# Patient Record
Sex: Male | Born: 1985 | Race: Black or African American | Hispanic: No | Marital: Married | Smoking: Current some day smoker
Health system: Southern US, Community
[De-identification: ages and names within clinical notes are randomized; demographics above are authoritative.]

## PROBLEM LIST (undated history)

## (undated) HISTORY — PX: HERNIA REPAIR: SHX51

---

## 2014-02-20 ENCOUNTER — Emergency Department (HOSPITAL_COMMUNITY)
Admission: EM | Admit: 2014-02-20 | Discharge: 2014-02-20 | Disposition: A | Discharge status: Home / Self Care | Payer: BC Managed Care – PPO | Attending: Emergency Medicine | Admitting: Emergency Medicine

## 2014-02-20 ENCOUNTER — Encounter (HOSPITAL_COMMUNITY): Payer: Self-pay | Admitting: Emergency Medicine

## 2014-02-20 DIAGNOSIS — L02219 Cutaneous abscess of trunk, unspecified: Secondary | ICD-10-CM | POA: Insufficient documentation

## 2014-02-20 DIAGNOSIS — L03319 Cellulitis of trunk, unspecified: Principal | ICD-10-CM

## 2014-02-20 DIAGNOSIS — L02214 Cutaneous abscess of groin: Secondary | ICD-10-CM

## 2014-02-20 DIAGNOSIS — F17200 Nicotine dependence, unspecified, uncomplicated: Secondary | ICD-10-CM | POA: Insufficient documentation

## 2014-02-20 MED ORDER — HYDROCODONE-ACETAMINOPHEN 5-325 MG PO TABS: 1.0000 | ORAL_TABLET | Freq: Four times a day (QID) | ORAL | Status: DC | PRN

## 2014-02-20 NOTE — Discharge Instructions (Signed)
1. Medications: vicodin for pain, usual home medications 2. Treatment: rest, drink plenty of fluids, warm compresses 3. Follow Up: Please followup with the emergency department if symptoms worsen, the urgent care Center in 48 hours for weight check and packing removal.  Followup with your doctor or an urgent care in order to remove your packing in 48-72 hours. You may return to the emergency department if you have  a fever that persists greater than 101 or your abscess appears to become infected (growing surrounding redness and warmth). Do not operate any heavy machinery while on pain medications. Do not consume alcohol on these medications either.  Abscess An abscess (boil or furuncle) is an infected area that contains a collection of pus.  SYMPTOMS Signs and symptoms of an abscess include pain, tenderness, redness, or hardness. You may feel a moveable soft area under your skin. An abscess can occur anywhere in the body.  TREATMENT  A surgical cut (incision) may be made over your abscess to drain the pus. Gauze may be packed into the space or a drain may be looped through the abscess cavity (pocket). This provides a drain that will allow the cavity to heal from the inside outwards. The abscess may be painful for a few days, but should feel much better if it was drained.  Your abscess, if seen early, may not have localized and may not have been drained. If not, another appointment may be required if it does not get better on its own or with medications. HOME CARE INSTRUCTIONS   Only take over-the-counter or prescription medicines for pain, discomfort, or fever as directed by your caregiver.   Take your antibiotics as directed if they were prescribed. Finish them even if you start to feel better.   Keep the skin and clothes clean around your abscess.   If the abscess was drained, you will need to use gauze dressing to collect any draining pus. Dressings will typically need to be changed 3 or more  times a day.   The infection may spread by skin contact with others. Avoid skin contact as much as possible.   Practice good hygiene. This includes regular hand washing, cover any draining skin lesions, and do not share personal care items.   If you participate in sports, do not share athletic equipment, towels, whirlpools, or personal care items. Shower after every practice or tournament.   If a draining area cannot be adequately covered:   Do not participate in sports.   Children should not participate in day care until the wound has healed or drainage stops.   If your caregiver has given you a follow-up appointment, it is very important to keep that appointment. Not keeping the appointment could result in a much worse infection, chronic or permanent injury, pain, and disability. If there is any problem keeping the appointment, you must call back to this facility for assistance.  SEEK MEDICAL CARE IF:   You develop increased pain, swelling, redness, drainage, or bleeding in the wound site.   You develop signs of generalized infection including muscle aches, chills, fever, or a general ill feeling.   You have an oral temperature above 102 F (38.9 C).  MAKE SURE YOU:   Understand these instructions.   Will watch your condition.   Will get help right away if you are not doing well or get worse.  Document Released: 08/31/2005 Document Revised: 08/03/2011 Document Reviewed: 06/24/2008 [facility] Patient Information 2012 [place], [place].

## 2014-02-20 NOTE — ED Provider Notes (Signed)
CSN: [id number]     Arrival date & time 02/20/14  1721 History   This chart was scribed for non-physician practitioner Cody ForthHannah Kashmir Leedy, PA-C working with Cody PoundMichael Y. Oletta LamasGhim, MD by Cody Owens, ED Scribe. This patient was seen in room TR10C/TR10C and the patient's care was started at 1813.   First MD Initiated Contact with Patient 02/20/14 1813     Chief Complaint  Patient presents with  . Leg Pain    The history is provided by the patient and medical records. No language interpreter was used.   HPI Comments: Cody SledgeMichael Eisen is a [age] y.o. male who presents to the Emergency Department complaining of gradual onset, gradually worsening right upper thigh pain and swelling that began last night. He has not tried any treatments. Nothing makes it better and walking makes it worse. He reports it has slowly worsened throughout the day. He denies a hx of abscesses. He denies rectal pain, testicular pain, scrotum pain or any other symptoms. He also denies urinary hesitancy or pain with defecation.  He currently smokes .5 pack of cigarettes/day.    History reviewed. No pertinent past medical history. Past Surgical History  Procedure Laterality Date  . Hernia repair      L inguinal   No family history on file. History  Substance Use Topics  . Smoking status: Current Every Day Smoker -- 0.50 packs/day    Types: Cigarettes  . Smokeless tobacco: Not on file  . Alcohol Use: 4.2 oz/week    7 Cans of beer per week    Review of Systems  Constitutional: Negative for fever and chills.  HENT: Negative for congestion and sore throat.   Gastrointestinal: Negative for nausea and vomiting.  Genitourinary: Negative for scrotal swelling, penile pain and testicular pain.  Skin: Positive for color change.      Allergies  Review of patient's allergies indicates no known allergies.  Home Medications   Current Outpatient Rx  Name  Route  Sig  Dispense  Refill  . HYDROcodone-acetaminophen  (NORCO/VICODIN) 5-325 MG per tablet   Oral   Take 1-2 tablets by mouth every 6 (six) hours as needed.   6 tablet   0     BP 132/73  Pulse 97  Temp(Src) 98.5 F (36.9 C) (Oral)  Resp 18  Ht 5\' 6"  (1.676 m)  Wt 232 lb (105.235 kg)  BMI 37.46 kg/m2  SpO2 98%  Physical Exam  Nursing note and vitals reviewed. Constitutional: He is oriented to person, place, and time. He appears well-developed and well-nourished. No distress.  HENT:  Head: Normocephalic and atraumatic.  Eyes: Conjunctivae are normal. No scleral icterus.  Neck: Normal range of motion.  Cardiovascular: Normal rate, regular rhythm, normal heart sounds and intact distal pulses.   No murmur heard. Pulmonary/Chest: Effort normal and breath sounds normal. No respiratory distress. He has no wheezes.  Abdominal: Soft. He exhibits no distension. There is no tenderness. Hernia confirmed negative in the right inguinal area and confirmed negative in the left inguinal area.  Genitourinary: Testes normal and penis normal. Rectal exam shows no external hemorrhoid, no fissure, no mass and no tenderness.    Right testis shows no mass, no swelling and no tenderness. Right testis is descended. Left testis shows no mass, no swelling and no tenderness. Left testis is descended. No phimosis, paraphimosis, hypospadias, penile erythema or penile tenderness. No discharge found.  3 cm x 2 cm area of erythema and induration at the top of the right thigh where  it joins the groin with mild pain to palpation of the right groin and mild lymphadenopathy. No swelling, erythema, induration or tenderness of the scrotum, testicles, perineum or perirectal region.   Lymphadenopathy:    He has no cervical adenopathy.       Right: Inguinal (mild) adenopathy present.       Left: No inguinal adenopathy present.  Neurological: He is alert and oriented to person, place, and time.  Skin: Skin is warm and dry. He is not diaphoretic. There is erythema.   Psychiatric: He has a normal mood and affect.    ED Course  Procedures (including critical care time) DIAGNOSTIC STUDIES: Oxygen Saturation is 98% on RA, normal by my interpretation.    COORDINATION OF CARE: 6:22 PM Discussed treatment plan which includes incision and drainage with pt at bedside and pt agreed to plan.   INCISION AND DRAINAGE PROCEDURE NOTE: Patient identification was confirmed and verbal consent was obtained. This procedure was performed by Cody Forth, PA-C at 6:35 PM. Site: top of right thigh Sterile procedures observed Needle size: 25 gauge Anesthetic used (type and amt): 5 cc Blade size: 11 lenght Drainage: copious  Complexity: Complex Packing used Site anesthetized, incision made over site, wound drained and explored loculations, rinsed with copious amounts of normal saline, wound packed with sterile gauze, covered with dry, sterile dressing.  Pt tolerated procedure well without complications.  Instructions for care discussed verbally and pt provided with additional written instructions for homecare and f/u.   Labs Review Labs Reviewed - No data to display Imaging Review No results found.   EKG Interpretation None      MDM   Final diagnoses:  Abscess of right groin   Cody Owens presents with history and physical consistent with abscess to the right upper thigh where it joins the groin. Thorough exam without evidence of Fournier's gangrene, perirectal abscess or scrotal abscess.  Patient is afebrile and not tachycardic.  He has no history of diabetes or other immunosuppression to decrease healing of the wound or increase infection.  Patient with skin abscess amenable to incision and drainage.  Abscess large enough to warrant packing, wound recheck in 2 days. Encouraged home warm compress.  Mild signs of cellulitis is surrounding skin.  Will d/c to home.  No antibiotic therapy is indicated.  Patient given strict followup instructions  for packing removal and wound check within 48 hours. Patient also warned about symptoms of Fournier's gangrene including fevers, pain with defecation, swelling or pain of the scrotum or rectum. He's been instructed to return immediately to the emergency department the symptoms develop.  It has been determined that no acute conditions requiring further emergency intervention are present at this time. The patient/guardian have been advised of the diagnosis and plan. We have discussed signs and symptoms that warrant return to the ED, such as changes or worsening in symptoms.   Vital signs are stable at discharge.   BP 132/73  Pulse 97  Temp(Src) 98.5 F (36.9 C) (Oral)  Resp 18  Ht 5\' 6"  (1.676 m)  Wt 232 lb (105.235 kg)  BMI 37.46 kg/m2  SpO2 98%  Patient/guardian has voiced understanding and agreed to follow-up with the PCP or specialist.    I personally performed the services described in this documentation, which was scribed in my presence. The recorded information has been reviewed and is accurate.    Cody Forth, PA-C 02/20/14 1902

## 2014-02-20 NOTE — ED Notes (Signed)
Pt c/o pain to inner thigh since yesterday.  Swelling and pain on palpation to inner thigh.

## 2014-02-22 ENCOUNTER — Emergency Department (HOSPITAL_COMMUNITY)
Admission: EM | Admit: 2014-02-22 | Discharge: 2014-02-22 | Disposition: A | Discharge status: Home / Self Care | Payer: BC Managed Care – PPO | Source: Home / Self Care | Attending: Family Medicine | Admitting: Family Medicine

## 2014-02-23 ENCOUNTER — Emergency Department (HOSPITAL_COMMUNITY)
Admission: EM | Admit: 2014-02-23 | Discharge: 2014-02-23 | Disposition: A | Discharge status: Home / Self Care | Payer: BC Managed Care – PPO | Attending: Emergency Medicine | Admitting: Emergency Medicine

## 2014-02-23 ENCOUNTER — Encounter (HOSPITAL_COMMUNITY): Payer: Self-pay | Admitting: Emergency Medicine

## 2014-02-23 DIAGNOSIS — L03319 Cellulitis of trunk, unspecified: Principal | ICD-10-CM

## 2014-02-23 DIAGNOSIS — L02219 Cutaneous abscess of trunk, unspecified: Secondary | ICD-10-CM | POA: Insufficient documentation

## 2014-02-23 DIAGNOSIS — F17200 Nicotine dependence, unspecified, uncomplicated: Secondary | ICD-10-CM | POA: Insufficient documentation

## 2014-02-23 DIAGNOSIS — L02214 Cutaneous abscess of groin: Secondary | ICD-10-CM

## 2014-02-23 DIAGNOSIS — Z5189 Encounter for other specified aftercare: Secondary | ICD-10-CM

## 2014-02-23 NOTE — ED Provider Notes (Signed)
CSN: [id number]     Arrival date & time 02/23/14  [id number] History  This chart was scribed for non-physician practitioner Jaynie Crumbleatyana Roxana Lai working with Hurman HornJohn M Bednar, MD by Carl Bestelina Holson, ED Scribe. This patient was seen in room TR09C/TR09C and the patient's care was started at 9:10 AM.    No chief complaint on file.   The history is provided by the patient. No language interpreter was used.   HPI Comments: Cody Owens is a 28 y.o. male who presents to the Emergency Department for packing removal from an incised and drained inguinal abscess.  He states that the procedure was done 3 days ago and is not as painful but is still draining.  The patient states that he has been changing the dressing regularly.  He denies fever and chills as associated symptoms.  He states that he was not discharged with antibiotics.  He denies having a history of abscesses.    No past medical history on file. Past Surgical History  Procedure Laterality Date  . Hernia repair      L inguinal   No family history on file. History  Substance Use Topics  . Smoking status: Current Every Day Smoker -- 0.50 packs/day    Types: Cigarettes  . Smokeless tobacco: Not on file  . Alcohol Use: 4.2 oz/week    7 Cans of beer per week    Review of Systems  Skin: Positive for wound (inguinal I&D site).  All other systems reviewed and are negative.      Allergies  Review of patient's allergies indicates no known allergies.  Home Medications   Current Outpatient Rx  Name  Route  Sig  Dispense  Refill  . HYDROcodone-acetaminophen (NORCO/VICODIN) 5-325 MG per tablet   Oral   Take 1-2 tablets by mouth every 6 (six) hours as needed.   6 tablet   0    BP 142/71  Pulse 88  Temp(Src) 98.8 F (37.1 C) (Oral)  Resp 20  Ht 5\' 6"  (1.676 m)  Wt 232 lb (105.235 kg)  BMI 37.46 kg/m2  SpO2 100%  Physical Exam  Nursing note and vitals reviewed. Constitutional: He is oriented to person, place, and time. He appears  well-developed and well-nourished.  HENT:  Head: Normocephalic and atraumatic.  Eyes: EOM are normal.  Neck: Normal range of motion.  Cardiovascular: Normal rate.   Pulmonary/Chest: Effort normal.  Musculoskeletal: Normal range of motion.  Neurological: He is alert and oriented to person, place, and time.  Skin: Skin is warm and dry.  2 cm abscess with packing to the right groin.  No surrounding erythema or swelling.  Minimal bloody drainage.   Psychiatric: He has a normal mood and affect. His behavior is normal.    ED Course  Procedures (including critical care time)  DIAGNOSTIC STUDIES: Oxygen Saturation is 100% on room air, normal by my interpretation.    COORDINATION OF CARE: 9:13 AM- Removed the packing from the patient's I&D site.  Advised the patient to take a warm bath several times to a day and monitor the area.  The patient agreed to the treatment plan.      Labs Review Labs Reviewed - No data to display Imaging Review No results found.   EKG Interpretation None      MDM   Final diagnoses:  Abscess of right groin  Encounter for wound re-check    Pt here for abscess recheck. Had an abscess of the right groin incised and drained 3 days  ago. States was doing well. He's not currently taking any medications, he was not placed on antibiotics. He denies any fever or chills. Abscess appears to be healing well, no surrounding swelling, erythema, induration. Minimal bloody drainage. Packing was removed. Sterile dressing applied. Discussed careful wound care at home including washing the wound with soap and water. Patient is to followup with his Dr. as needed.  Filed Vitals:   02/23/14 0901  BP: 142/71  Pulse: 88  Temp: 98.8 F (37.1 C)  TempSrc: Oral  Resp: 20  Height: 5\' 6"  (1.676 m)  Weight: 232 lb (105.235 kg)  SpO2: 100%    I personally performed the services described in this documentation, which was scribed in my presence. The recorded information has  been reviewed and is accurate.    Lottie Mussel, PA-C 02/23/14 1119

## 2014-02-23 NOTE — Discharge Instructions (Signed)
Continue to keep an eye on the wound. Warm soapy baths few times a day. Keep wound clean. Wash with soap and water. Follow up as needed. Return if increased swelling, drainage, redness, fever.    Abscess An abscess is an infected area that contains a collection of pus and debris.It can occur in almost any part of the body. An abscess is also known as a furuncle or boil. CAUSES  An abscess occurs when tissue gets infected. This can occur from blockage of oil or sweat glands, infection of hair follicles, or a minor injury to the skin. As the body tries to fight the infection, pus collects in the area and creates pressure under the skin. This pressure causes pain. People with weakened immune systems have difficulty fighting infections and get certain abscesses more often.  SYMPTOMS Usually an abscess develops on the skin and becomes a painful mass that is red, warm, and tender. If the abscess forms under the skin, you may feel a moveable soft area under the skin. Some abscesses break open (rupture) on their own, but most will continue to get worse without care. The infection can spread deeper into the body and eventually into the bloodstream, causing you to feel ill.  DIAGNOSIS  Your caregiver will take your medical history and perform a physical exam. A sample of fluid may also be taken from the abscess to determine what is causing your infection. TREATMENT  Your caregiver may prescribe antibiotic medicines to fight the infection. However, taking antibiotics alone usually does not cure an abscess. Your caregiver may need to make a small cut (incision) in the abscess to drain the pus. In some cases, gauze is packed into the abscess to reduce pain and to continue draining the area. HOME CARE INSTRUCTIONS   Only take over-the-counter or prescription medicines for pain, discomfort, or fever as directed by your caregiver.  If you were prescribed antibiotics, take them as directed. Finish them even if you  start to feel better.  If gauze is used, follow your caregiver's directions for changing the gauze.  To avoid spreading the infection:  Keep your draining abscess covered with a bandage.  Wash your hands well.  Do not share personal care items, towels, or whirlpools with others.  Avoid skin contact with others.  Keep your skin and clothes clean around the abscess.  Keep all follow-up appointments as directed by your caregiver. SEEK MEDICAL CARE IF:   You have increased pain, swelling, redness, fluid drainage, or bleeding.  You have muscle aches, chills, or a general ill feeling.  You have a fever. MAKE SURE YOU:   Understand these instructions.  Will watch your condition.  Will get help right away if you are not doing well or get worse. Document Released: 08/31/2005 Document Revised: 05/22/2012 Document Reviewed: 02/03/2012 [facility] Patient Information 2014 [place] NorthExit[place], [place].

## 2014-02-23 NOTE — ED Notes (Signed)
Pt states here for packing removal to right groin area.

## 2014-02-24 NOTE — ED Provider Notes (Signed)
Medical screening examination/treatment/procedure(s) were performed by non-physician practitioner and as supervising physician I was immediately available for consultation/collaboration.  [name], MD 02/24/14 [phone]

## 2014-02-25 NOTE — ED Provider Notes (Signed)
Medical screening examination/treatment/procedure(s) were performed by non-physician practitioner and as supervising physician I was immediately available for consultation/collaboration.     Hurman HornJohn M Kinshasa Throckmorton, MD 02/25/14 [phone]

## 2014-04-02 ENCOUNTER — Encounter (HOSPITAL_COMMUNITY): Payer: Self-pay | Admitting: Emergency Medicine

## 2014-04-02 ENCOUNTER — Emergency Department (HOSPITAL_COMMUNITY)
Admission: EM | Admit: 2014-04-02 | Discharge: 2014-04-02 | Disposition: A | Discharge status: Home / Self Care | Payer: BC Managed Care – PPO | Attending: Emergency Medicine | Admitting: Emergency Medicine

## 2014-04-02 DIAGNOSIS — F17200 Nicotine dependence, unspecified, uncomplicated: Secondary | ICD-10-CM | POA: Insufficient documentation

## 2014-04-02 DIAGNOSIS — Z23 Encounter for immunization: Secondary | ICD-10-CM | POA: Insufficient documentation

## 2014-04-02 DIAGNOSIS — Z8719 Personal history of other diseases of the digestive system: Secondary | ICD-10-CM | POA: Insufficient documentation

## 2014-04-02 DIAGNOSIS — Y929 Unspecified place or not applicable: Secondary | ICD-10-CM | POA: Insufficient documentation

## 2014-04-02 DIAGNOSIS — IMO0002 Reserved for concepts with insufficient information to code with codable children: Secondary | ICD-10-CM | POA: Insufficient documentation

## 2014-04-02 DIAGNOSIS — S0531XA Ocular laceration without prolapse or loss of intraocular tissue, right eye, initial encounter: Secondary | ICD-10-CM

## 2014-04-02 DIAGNOSIS — H11429 Conjunctival edema, unspecified eye: Secondary | ICD-10-CM | POA: Insufficient documentation

## 2014-04-02 DIAGNOSIS — S0530XA Ocular laceration without prolapse or loss of intraocular tissue, unspecified eye, initial encounter: Secondary | ICD-10-CM | POA: Insufficient documentation

## 2014-04-02 DIAGNOSIS — Y9389 Activity, other specified: Secondary | ICD-10-CM | POA: Insufficient documentation

## 2014-04-02 DIAGNOSIS — S058X9A Other injuries of unspecified eye and orbit, initial encounter: Secondary | ICD-10-CM | POA: Insufficient documentation

## 2014-04-02 MED ORDER — MOXIFLOXACIN HCL 0.5 % OP SOLN: 1.0000 [drp] | Freq: Four times a day (QID) | OPHTHALMIC | Status: DC

## 2014-04-02 MED ORDER — TETANUS-DIPHTH-ACELL PERTUSSIS 5-2.5-18.5 LF-MCG/0.5 IM SUSP
0.5000 mL | Freq: Once | INTRAMUSCULAR | Status: AC
  Administered 2014-04-02: 0.5 mL via INTRAMUSCULAR
  Filled 2014-04-02: qty 0.5

## 2014-04-02 MED ORDER — ATROPINE SULFATE 1 % OP OINT: 1.0000 "application " | TOPICAL_OINTMENT | Freq: Every day | OPHTHALMIC | Status: DC

## 2014-04-02 MED ORDER — FLUORESCEIN SODIUM 1 MG OP STRP
1.0000 | ORAL_STRIP | Freq: Once | OPHTHALMIC | Status: DC
  Filled 2014-04-02: qty 1

## 2014-04-02 MED ORDER — TETRACAINE HCL 0.5 % OP SOLN
2.0000 [drp] | Freq: Once | OPHTHALMIC | Status: DC
  Filled 2014-04-02: qty 2

## 2014-04-02 NOTE — ED Provider Notes (Signed)
Medical screening examination/treatment/procedure(s) were performed by non-physician practitioner and as supervising physician I was immediately available for consultation/collaboration.   EKG Interpretation None       Thelbert Gartin K Linker, MD 04/02/14 1844 

## 2014-04-02 NOTE — ED Provider Notes (Signed)
CSN: [id number]     Arrival date & time 04/02/14  1530 History  This chart was scribed for non-physician practitioner [name], PA-C working with Ethelda ChickMartha K Linker, MD by Dorothey Basemania Sutton, ED Scribe. This patient was seen in room TR04C/TR04C and the patient's care was started at 4:21 PM.    Chief Complaint  Patient presents with  . Eye Injury   The history is provided by the patient and medical records. No language interpreter was used.   HPI Comments: Cody Owens is a 28 y.o. male who presents to the Emergency Department complaining of an injury to the right eye that he sustained last night, around 16 hours ago, by accidentally hitting the area with an electrical plug. Patient is complaining of a constant, sudden onset pain to the area with associated eye redness, drainage, and blurred vision. Patient does not remember when his last tetanus vaccination was. He denies any allergies to medications. Patient has no other pertinent medical history.   History reviewed. No pertinent past medical history. Past Surgical History  Procedure Laterality Date  . Hernia repair      L inguinal   History reviewed. No pertinent family history. History  Substance Use Topics  . Smoking status: Current Every Day Smoker -- 0.50 packs/day    Types: Cigarettes  . Smokeless tobacco: Not on file  . Alcohol Use: 4.2 oz/week    7 Cans of beer per week    Review of Systems  Eyes: Positive for pain, discharge, redness and visual disturbance.  All other systems reviewed and are negative.  Allergies  Review of patient's allergies indicates no known allergies.  Home Medications   Prior to Admission medications   Not on File   Triage Vitals: BP 147/68  Pulse 78  Temp(Src) 99.1 F (37.3 C) (Oral)  Resp 20  SpO2 98%  Physical Exam  Nursing note and vitals reviewed. Constitutional: He appears well-developed and well-nourished. No distress.  Awake, alert, nontoxic appearance  HENT:  Head:  Normocephalic and atraumatic.  Mouth/Throat: Oropharynx is clear and moist. No oropharyngeal exudate.  Eyes: EOM are normal. Pupils are equal, round, and reactive to light. Lids are everted and swept, no foreign bodies found. Right eye exhibits chemosis (mild) and discharge (moderate, purulent). Right eye exhibits no hordeolum. No foreign body present in the right eye. Left eye exhibits no chemosis, no discharge, no exudate and no hordeolum. No foreign body present in the left eye. Right conjunctiva is injected. Right conjunctiva has no hemorrhage. Left conjunctiva is not injected. Left conjunctiva has no hemorrhage. No scleral icterus.  Fundoscopic exam:      The right eye shows no arteriolar narrowing, no AV nicking, no exudate, no hemorrhage and no papilledema.       The left eye shows no arteriolar narrowing, no AV nicking, no exudate, no hemorrhage and no papilledema.  Slit lamp exam:      The right eye shows corneal abrasion and fluorescein uptake. The right eye shows no corneal flare, no corneal ulcer, no foreign body, no hyphema and no anterior chamber bulge.       The left eye shows no hyphema and no anterior chamber bulge.    Visible, purulent drainage from the right eye. Injected cornea on the anterior side. Full EOMs without pain or diplopia. Superficial 0.5cm linear laceration to the cornea at the 12:00 position above the iris with associated abrasion at the most superior portion of the laceration. Negative Seidel's sign.  Visual Acuity -  Bilateral Distance: 20/20 ;  R Distance: 20/30 ;  L Distance: 20/20  Neck: Normal range of motion. Neck supple.  Cardiovascular: Normal rate, regular rhythm, normal heart sounds and intact distal pulses.   No murmur heard. Pulmonary/Chest: Effort normal and breath sounds normal. No respiratory distress. He has no wheezes.  Musculoskeletal: Normal range of motion. He exhibits no edema.  Neurological: He is alert.  Speech is clear and goal  oriented Moves extremities without ataxia  Skin: Skin is warm and dry. He is not diaphoretic.  Psychiatric: He has a normal mood and affect.    ED Course  Procedures (including critical care time)  DIAGNOSTIC STUDIES: Oxygen Saturation is 98% on room air, normal by my interpretation.    COORDINATION OF CARE: 4:29 PM- Discussed that symptoms are due to an abrasion in the area. Will discharge patient with antibiotics and pain medication to manage symptoms. Advised patient to follow up with the referred ophthalmologist tomorrow. Discussed treatment plan with patient at bedside and patient verbalized agreement.     Labs Review Labs Reviewed - No data to display  Imaging Review No results found.   EKG Interpretation None      MDM   Final diagnoses:  Corneal laceration of right eye   Cody SledgeMichael Brasel presents with eye injury.  Exam consistent with corneal laceration; no Seidel's sign; doubt globe rupture.  Pt with decreased vision in the right eye and purulent drainage.  Concern for possible secondary infection. Will update tetanus and consult with optho.   6:18 PM  Discussed with Dr. Allena KatzPatel who recommends Vigamox QID and Atropine QD.  She will see the pt in her office tomorrow.  All of this has been discussed with the patient and he agrees to see Dr. Allena KatzPatel tomorrow.  He may return here if symptoms worsen, but ultimately he will need to see opthalmology.    It has been determined that no acute conditions requiring further emergency intervention are present at this time. The patient/guardian have been advised of the diagnosis and plan. We have discussed signs and symptoms that warrant return to the ED, such as changes or worsening in symptoms.   Vital signs are stable at discharge.   BP 147/68  Pulse 78  Temp(Src) 99.1 F (37.3 C) (Oral)  Resp 20  SpO2 98%  Patient/guardian has voiced understanding and agreed to follow-up with the PCP or specialist.   I personally performed  the services described in this documentation, which was scribed in my presence. The recorded information has been reviewed and is accurate.    [name] Tony, PA-C 04/02/14 1826

## 2014-04-02 NOTE — Discharge Instructions (Signed)
1. Medications: vigamox, atropine, usual home medications 2. Treatment: rest, drink plenty of fluids, take medications as prescribed 3. Follow Up: PLEASE CALL DR PATEL'S OFFICE IN THE MORNING TO SCHEDULE A FOLLOW-UP APPOINTMENT FOR TOMORROW

## 2014-04-02 NOTE — ED Notes (Signed)
Per pt sts he hit right eye pulling a cord out of a plug last night and since eye has been painful, cloudy and red. sts he can see out of eye just blurry.

## 2015-08-27 ENCOUNTER — Emergency Department (HOSPITAL_COMMUNITY)
Admission: EM | Admit: 2015-08-27 | Discharge: 2015-08-28 | Disposition: A | Discharge status: Home / Self Care | Payer: BLUE CROSS/BLUE SHIELD | Attending: Emergency Medicine | Admitting: Emergency Medicine

## 2015-08-27 ENCOUNTER — Encounter (HOSPITAL_COMMUNITY): Payer: Self-pay | Admitting: *Deleted

## 2015-08-27 DIAGNOSIS — S61511A Laceration without foreign body of right wrist, initial encounter: Secondary | ICD-10-CM | POA: Diagnosis present

## 2015-08-27 DIAGNOSIS — Y998 Other external cause status: Secondary | ICD-10-CM | POA: Diagnosis not present

## 2015-08-27 DIAGNOSIS — Z720 Tobacco use: Secondary | ICD-10-CM | POA: Diagnosis not present

## 2015-08-27 DIAGNOSIS — Z23 Encounter for immunization: Secondary | ICD-10-CM | POA: Insufficient documentation

## 2015-08-27 DIAGNOSIS — Y9389 Activity, other specified: Secondary | ICD-10-CM | POA: Insufficient documentation

## 2015-08-27 DIAGNOSIS — S0081XA Abrasion of other part of head, initial encounter: Secondary | ICD-10-CM | POA: Insufficient documentation

## 2015-08-27 DIAGNOSIS — W25XXXA Contact with sharp glass, initial encounter: Secondary | ICD-10-CM | POA: Insufficient documentation

## 2015-08-27 DIAGNOSIS — F10120 Alcohol abuse with intoxication, uncomplicated: Secondary | ICD-10-CM | POA: Diagnosis not present

## 2015-08-27 DIAGNOSIS — IMO0002 Reserved for concepts with insufficient information to code with codable children: Secondary | ICD-10-CM

## 2015-08-27 DIAGNOSIS — Z79899 Other long term (current) drug therapy: Secondary | ICD-10-CM | POA: Insufficient documentation

## 2015-08-27 DIAGNOSIS — Y9289 Other specified places as the place of occurrence of the external cause: Secondary | ICD-10-CM | POA: Diagnosis not present

## 2015-08-27 DIAGNOSIS — Z792 Long term (current) use of antibiotics: Secondary | ICD-10-CM | POA: Diagnosis not present

## 2015-08-27 DIAGNOSIS — F10920 Alcohol use, unspecified with intoxication, uncomplicated: Secondary | ICD-10-CM

## 2015-08-27 MED ORDER — LIDOCAINE HCL (PF) 1 % IJ SOLN
5.0000 mL | Freq: Once | INTRAMUSCULAR | Status: AC
  Administered 2015-08-28: 5 mL via INTRADERMAL
  Filled 2015-08-27: qty 5

## 2015-08-27 MED ORDER — TETANUS-DIPHTH-ACELL PERTUSSIS 5-2.5-18.5 LF-MCG/0.5 IM SUSP
0.5000 mL | Freq: Once | INTRAMUSCULAR | Status: AC
  Administered 2015-08-28: 0.5 mL via INTRAMUSCULAR
  Filled 2015-08-27 (×2): qty 0.5

## 2015-08-27 NOTE — ED Provider Notes (Signed)
CSN: [id number]     Arrival date & time 08/27/15  2335 History  This chart was scribed for non-physician practitioner Catha Gosselin, PA-C working with Dione Booze, MD by Lyndel Safe, ED Scribe. This patient was seen in room TR07C/TR07C and the patient's care was started at 11:48 PM.   Chief Complaint  Patient presents with  . Extremity Laceration   The history is provided by the patient. No language interpreter was used.   HPI Comments: Cody Owens is a 29 y.o. male brought in by ambulance, who presents to the Emergency Department complaining of a laceration to right wrist and a small abrasion to right eyebrow that occurred PTA s/p fall into a glass coffee table. Per EMS the patient is intoxicated. His girlfriend reports that she heard a loud noise while sleeping and went to check on him. She states that she found him laying on the floor. She also reports that he drank an entire bottle of liquor tonight. He states that he drinks daily. He denies LOC.    History reviewed. No pertinent past medical history. Past Surgical History  Procedure Laterality Date  . Hernia repair      L inguinal   No family history on file. Social History  Substance Use Topics  . Smoking status: Current Every Day Smoker -- 0.50 packs/day    Types: Cigarettes  . Smokeless tobacco: None  . Alcohol Use: 4.2 oz/week    7 Cans of beer per week    Review of Systems  Skin: Positive for wound ( laceration to right wrist and abrasion to right eyebrow ).  Neurological: Negative for syncope.    Allergies  Review of patient's allergies indicates no known allergies.  Home Medications   Prior to Admission medications   Medication Sig Start Date End Date Taking? Authorizing Provider  atropine 1 % ophthalmic ointment Place 1 application into the right eye daily. 04/02/14   Hannah Muthersbaugh, PA-C  moxifloxacin (VIGAMOX) 0.5 % ophthalmic solution Place 1 drop into the right eye 4 (four) times daily. 04/02/14    Hannah Muthersbaugh, PA-C   BP 127/71 mmHg  Pulse 102  Temp(Src) 98.1 F (36.7 C) (Oral)  Resp 18  Ht  (1.676 m)  Wt 143 lb (64.864 kg)  BMI 23.09 kg/m2  SpO2 95% Physical Exam  Constitutional: He is oriented to person, place, and time. He appears well-developed and well-nourished. No distress.  Appears intoxicated.   HENT:  Head: Normocephalic.  Eyes:  Abrasion just below right eyebrow with no active bleeding, conjunctiva injected.   Neck: Normal range of motion. Neck supple.  Cardiovascular: Normal rate.   Pulmonary/Chest: Effort normal. No respiratory distress.  Abdominal: Soft. Normal appearance. There is no tenderness.  His shirt has blood stains. No abrasions or lacerations to the abdomen or chest.  Musculoskeletal: Normal range of motion.  Neurological: He is alert and oriented to person, place, and time. Coordination normal.  Skin: Skin is warm.  2cm laceration to right wrist with no active bleeding, no foreign bodies. There is fat exposure but the bone or tendons could not be visualized.  Psychiatric: He has a normal mood and affect. His behavior is normal.  Nursing note and vitals reviewed.   ED Course  Wound closure utilizing adhes only Date/Time: 08/28/2015 12:23 AM Performed by: Catha Gosselin Authorized by: Catha Gosselin Consent: Verbal consent obtained. Written consent obtained. Risks and benefits: risks, benefits and alternatives were discussed Consent given by: patient Patient understanding: patient states understanding of  the procedure being performed Patient consent: the patient's understanding of the procedure matches consent given Patient identity confirmed: verbally with patient Preparation: Patient was prepped and draped in the usual sterile fashion. Local anesthesia used: no Patient sedated: no Patient tolerance: Patient tolerated the procedure well with no immediate complications Comments: Dermabond was applied to the 0.5 cm  abrasion below the right eyebrow. No active bleeding. No foreign body. The wound was cleaned with saline and gauze.    DIAGNOSTIC STUDIES: Oxygen Saturation is 95% on RA, adequate by my interpretation.    COORDINATION OF CARE: 11:50 PM Discussed treatment plan with pt at bedside which includes to perform laceration repair procedure and pt agreed to plan.  LACERATION REPAIR PROCEDURE NOTE The patient's identification was confirmed and consent was obtained. This procedure was performed by Catha Gosselin, PA-C at 1:20 AM. Site: right wrist  Sterile procedures observed Anesthetic used (type and amt): lidocaine 1% without epinephrine  Suture type/size: 5-0 prolene Length: 2cm  # of Sutures: 4 Technique: single interupted Complexity: simple Tetanus: Patient refused Site anesthetized, irrigated with NS under tap water, explored without evidence of foreign body, wound well approximated, site covered with dry, sterile dressing.  Patient tolerated procedure well without complications. Instructions for care discussed verbally and patient provided with additional written instructions for homecare and f/u.    MDM   Final diagnoses:  Alcohol intoxication, uncomplicated  Laceration  Patient presents for alcohol intoxication and laceration after falling onto a glass table. He refused a tetanus vaccination and x-ray. He appears intoxicated but his vital signs are stable. I was able to suture his right wrist without difficulty. Due to being intoxicated he could not be discharged until his girlfriend comes back to pick him up. I discussed this patient with Mckinley Jewel. PA-C and explained that he could be discharged once he is able to ambulate.  I personally performed the services described in this documentation, which was scribed in my presence. The recorded information has been reviewed and is accurate.   Catha Gosselin, PA-C 08/28/15 0120  Dione Booze, MD 08/28/15 0300

## 2015-08-27 NOTE — ED Notes (Signed)
Pt arrives via EMS with c/o fall into a glass coffee table. Pt has lacerations to the right forearm and right eye brow. No loc. Pt has been drinking tonight.

## 2015-08-28 MED ORDER — BACITRACIN ZINC 500 UNIT/GM EX OINT
TOPICAL_OINTMENT | Freq: Two times a day (BID) | CUTANEOUS | Status: DC
  Administered 2015-08-28: 1 via TOPICAL
  Filled 2015-08-28: qty 0.9

## 2015-08-28 NOTE — Discharge Instructions (Signed)
Alcohol Intoxication Have the sutures removed in 7-10 days. Keep the wound clean and dry. Return for swelling or drainage from the wound, nausea, vomiting, confusion, or shaking. Alcohol intoxication occurs when you drink enough alcohol that it affects your ability to function. It can be mild or very severe. Drinking a lot of alcohol in a short time is called binge drinking. This can be very harmful. Drinking alcohol can also be more dangerous if you are taking medicines or other drugs. Some of the effects caused by alcohol may include:  Loss of coordination.  Changes in mood and behavior.  Unclear thinking.  Trouble talking (slurred speech).  Throwing up (vomiting).  Confusion.  Slowed breathing.  Twitching and shaking (seizures).  Loss of consciousness. HOME CARE  Do not drive after drinking alcohol.  Drink enough water and fluids to keep your pee (urine) clear or pale yellow. Avoid caffeine.  Only take medicine as told by your doctor. GET HELP IF:  You throw up (vomit) many times.  You do not feel better after a few days.  You frequently have alcohol intoxication. Your doctor can help decide if you should see a substance use treatment counselor. GET HELP RIGHT AWAY IF:  You become shaky when you stop drinking.  You have twitching and shaking.  You throw up blood. It may look bright red or like coffee grounds.  You notice blood in your poop (bowel movements).  You become lightheaded or pass out (faint). MAKE SURE YOU:   Understand these instructions.  Will watch your condition.  Will get help right away if you are not doing well or get worse. Document Released: 05/09/2008 Document Revised: 07/24/2013 Document Reviewed: 04/26/2013 [facility] Patient Information 2015 [place], [place]. This information is not intended to replace advice given to you by your health care provider. Make sure you discuss any questions you have with your health care provider.  Laceration  Care, Adult A laceration is a cut or lesion that goes through all layers of the skin and into the tissue just beneath the skin. TREATMENT  Some lacerations may not require closure. Some lacerations may not be able to be closed due to an increased risk of infection. It is important to see your caregiver as soon as possible after an injury to minimize the risk of infection and maximize the opportunity for successful closure. If closure is appropriate, pain medicines may be given, if needed. The wound will be cleaned to help prevent infection. Your caregiver will use stitches (sutures), staples, wound glue (adhesive), or skin adhesive strips to repair the laceration. These tools bring the skin edges together to allow for faster healing and a better cosmetic outcome. However, all wounds will heal with a scar. Once the wound has healed, scarring can be minimized by covering the wound with sunscreen during the day for 1 full year. HOME CARE INSTRUCTIONS  For sutures or staples:  Keep the wound clean and dry.  If you were given a bandage (dressing), you should change it at least once a day. Also, change the dressing if it becomes wet or dirty, or as directed by your caregiver.  Wash the wound with soap and water 2 times a day. Rinse the wound off with water to remove all soap. Pat the wound dry with a clean towel.  After cleaning, apply a thin layer of the antibiotic ointment as recommended by your caregiver. This will help prevent infection and keep the dressing from sticking.  You may shower as usual  after the first 24 hours. Do not soak the wound in water until the sutures are removed.  Only take over-the-counter or prescription medicines for pain, discomfort, or fever as directed by your caregiver.  Get your sutures or staples removed as directed by your caregiver. For skin adhesive strips:  Keep the wound clean and dry.  Do not get the skin adhesive strips wet. You may bathe carefully, using  caution to keep the wound dry.  If the wound gets wet, pat it dry with a clean towel.  Skin adhesive strips will fall off on their own. You may trim the strips as the wound heals. Do not remove skin adhesive strips that are still stuck to the wound. They will fall off in time. For wound adhesive:  You may briefly wet your wound in the shower or bath. Do not soak or scrub the wound. Do not swim. Avoid periods of heavy perspiration until the skin adhesive has fallen off on its own. After showering or bathing, gently pat the wound dry with a clean towel.  Do not apply liquid medicine, cream medicine, or ointment medicine to your wound while the skin adhesive is in place. This may loosen the film before your wound is healed.  If a dressing is placed over the wound, be careful not to apply tape directly over the skin adhesive. This may cause the adhesive to be pulled off before the wound is healed.  Avoid prolonged exposure to sunlight or tanning lamps while the skin adhesive is in place. Exposure to ultraviolet light in the first year will darken the scar.  The skin adhesive will usually remain in place for 5 to 10 days, then naturally fall off the skin. Do not pick at the adhesive film. You may need a tetanus shot if:  You cannot remember when you had your last tetanus shot.  You have never had a tetanus shot. If you get a tetanus shot, your arm may swell, get red, and feel warm to the touch. This is common and not a problem. If you need a tetanus shot and you choose not to have one, there is a rare chance of getting tetanus. Sickness from tetanus can be serious. SEEK MEDICAL CARE IF:   You have redness, swelling, or increasing pain in the wound.  You see a red line that goes away from the wound.  You have yellowish-white fluid (pus) coming from the wound.  You have a fever.  You notice a bad smell coming from the wound or dressing.  Your wound breaks open before or after sutures have  been removed.  You notice something coming out of the wound such as wood or glass.  Your wound is on your hand or foot and you cannot move a finger or toe. SEEK IMMEDIATE MEDICAL CARE IF:   Your pain is not controlled with prescribed medicine.  You have severe swelling around the wound causing pain and numbness or a change in color in your arm, hand, leg, or foot.  Your wound splits open and starts bleeding.  You have worsening numbness, weakness, or loss of function of any joint around or beyond the wound.  You develop painful lumps near the wound or on the skin anywhere on your body. MAKE SURE YOU:   Understand these instructions.  Will watch your condition.  Will get help right away if you are not doing well or get worse. Document Released: 11/21/2005 Document Revised: 02/13/2012 Document Reviewed: 05/17/2011 Exit[employer] Patient Information  2015 [employer]. This information is not intended to replace advice given to you by your health care provider. Make sure you discuss any questions you have with your health care provider.  Emergency Department Resource Guide 1) Find a Doctor and Pay Out of Pocket Although you won't have to find out who is covered by your insurance plan, it is a good idea to ask around and get recommendations. You will then need to call the office and see if the doctor you have chosen will accept you as a new patient and what types of options they offer for patients who are self-pay. Some doctors offer discounts or will set up payment plans for their patients who do not have insurance, but you will need to ask so you aren't surprised when you get to your appointment.  2) Contact Your Local Health Department Not all health departments have doctors that can see patients for sick visits, but many do, so it is worth a call to see if yours does. If you don't know where your local health department is, you can check in your phone book. The CDC also has a tool to help  you locate your state's health department, and many state websites also have listings of all of their local health departments.  3) Find a Walk-in Clinic If your illness is not likely to be very severe or complicated, you may want to try a walk in clinic. These are popping up all over the country in pharmacies, drugstores, and shopping centers. They're usually staffed by nurse practitioners or physician assistants that have been trained to treat common illnesses and complaints. They're usually fairly quick and inexpensive. However, if you have serious medical issues or chronic medical problems, these are probably not your best option.  No Primary Care Doctor: - Call Health Connect at  [phone] - they can help you locate a primary care doctor that  accepts your insurance, provides certain services, etc. - Physician Referral Service- [phone]  Chronic Pain Problems: Organization         Address  Phone   Notes  [facility]  [phone] Patients need to be referred by their primary care doctor.   Medication Assistance: Organization         Address  Phone   Notes  [facility] Medication [facility] [address] [place] [place], [place] [address] [phone] --Must be a resident of [facility] -- Must have NO insurance coverage whatsoever (no Medicaid/ Medicare, etc.) -- The pt. MUST have a primary care doctor that directs their care regularly and follows them in the community   [facility]ist  [phone]   [employer]  [phone]    Agencies that provide inexpensive medical care: Organization         Address  Phone   Notes  [facility]  [phone]   [facility]    [phone]   [facility] [address] [place], [place] [address] [phone]   [facility] [address], [place] [phone]   [facility]anned Parenthood    [phone]   [facility]    [phone]   [facility] and [facility]  [address], [place] Phone:  [phone], Fax:  [phone] Hours of Operation:  9 am - 6 pm, M-F.  Also accepts Medicaid/Medicare and self-pay.  [facility]  [address], [place] Phone: [phone], Fax: [phone]. Hours of Operation:  8:30 am - 5:30 pm, M-F.  Also accepts Medicaid and self-pay.  [facility] [place] [address] [place] Phone: [phone]   [facility] [address] Natasha Bence [place], [place] [phone], Ext. [phone] Mondays & Thursdays: 7-9 AM.  First 15 patients are seen on a first come, first serve basis.    Medicaid-accepting [facility] Providers:  Organization         Address  Phone   Notes  [facility] [address],  [phone] Also accepts self-pay patients.  [facility] [address] Laurell Josephs [place], [place]  [phone]   [facility] [address], [place] [phone]   [facility] [address] [place] [phone]   Renaye Rakers [address], [place]   [phone] Only accepts [place] Access [place] patients after they have their name applied to their card.   Self-Pay (no insurance) in [facility]:  Organization         Address  Phone   Notes  Sickle Cell Patients, [facility] Internal Medicine [address] [place], [place] [phone]   [facility] [address] [place], [place] [phone]   [facility]  [address], [place] [phone]   [facility]/Dr. Osei-Bonsu  [address], [place] or [address], [place] [phone] Phone number for both [place] and [place] locations is the same.  [facility] [address],  [place] [phone]   [facility] [address] [place] or [address] [phone]   [facility] [address], [place] [phone], phone; [phone], fax Sees patients 1st and 3rd Saturday of every month.  Must not qualify for public or private insurance (i.e. Medicaid, Medicare, [place] Health Choice, Veterans' Benefits)  Household income should be no more than 200% of the poverty level The clinic cannot treat you if you are pregnant or think you are pregnant  Sexually transmitted diseases are not treated at the clinic.    Dental Care: Organization         Address  Phone  Notes  [facility] Department of [facility] [address] [place], [place] [phone] Accepts children up to age 68 who are enrolled in [place] or [place] Health Choice; pregnant women with a Medicaid card; and children who have applied for Medicaid or [place] Health Choice, but were declined, whose parents can pay a reduced fee at time of service.  [facility] Department of [facility]  [address], [place] [phone] Accepts children up to age 72 who are enrolled in [place] or [place] Health Choice; pregnant women with a Medicaid card; and children who have applied for Medicaid or  Health Choice, but were declined, whose parents can pay a reduced fee at time of service.  [facility]  [address] [place], [place] [phone] Patients are seen by appointment only. Walk-ins are not accepted. [facility] will see patients 56 years of age and older. Monday - Tuesday (8am-5pm) Most Wednesdays (8:30-5pm) $30 per visit, cash only  [employer] Adult [facility]  Access PROGRAM  [address], [facility] [phone] Patients are seen by appointment only. Walk-ins are not accepted. [facility] will see patients 80 years of age and older. One Wednesday Evening  (Monthly: Volunteer Based).  $30 per visit, cash only  [employer]  [phone] for adults; Children under age 44, call Graduate Pediatric Dentistry at [phone]. Children aged 55-14, please call [phone] to request a pediatric application.  Dental services are provided in all areas of dental care including fillings, crowns and bridges, complete and partial dentures, implants, gum treatment, root canals, and extractions. Preventive care is also provided. Treatment is provided to both adults and children. Patients are selected via a lottery and there is often a waiting list.   [facility] [address] [place]  [phone] [email or web address]   [facility] [address] [place], [place] [phone], Ext. [phone] Second and Fourth Thursday of each month, opens at 6:30 AM; Clinic ends at 9 AM.  Patients are seen on a first-come first-served basis, and a limited number are seen during each clinic.   [facility]  [address] Ether Griffins [place], [place] [phone]   Eligibility Requirements You must have lived in [place], [place], or [place] counties for at least the last three months.   You cannot be eligible for state or federal sponsored [place], including [employer], [place], or [employer].   You generally cannot be eligible for healthcare insurance through your employer.    How to apply: Eligibility screenings are held every Tuesday and Wednesday afternoon from 1:00 pm until 4:00 pm. You do not need an appointment for the interview!  [facility] [address], [place], [place] [phone]   [facility] Health Department  [phone]   [facility] Health Department  [phone]   [facility] Health Department  [phone]    Behavioral Health Resources in the Community: Intensive Outpatient Programs Organization         Address  Phone  Notes  [facility] [address] [place], [place] [phone]   [facility] Outpatient [address], [place], [place] [phone]   [facility] [address] [place], [place]  [phone]   [facility] Mental Health [address],  [place], [place] [phone] or [phone]   Substance Abuse Resources Organization         Address  Phone  Notes  [facility]  [phone]   [facility]  [phone]   [facility]  [phone]   Floydene Flock  [phone]   Residential & Outpatient Substance Abuse Program  [phone]   Psychological Services Organization         Address  Phone  Notes  [facility] Behavioral Health  [phone]   [facility]  [phone]   [facility] Mental Health [address] [place] [phone] or [phone]    Mobile Crisis Teams Organization         Address  Phone  Notes  [facility], Mobile Crisis Care Unit  [phone]   [facility]  [address] [place], [place] [phone]   Doristine Locks [address] [place] [phone]    Self-Help/Support Groups Organization         Address  Phone             Notes  [facility] [place] - variety of support groups  336- I7437963 Call for more information  Narcotics Anonymous (NA), [facility] [address], [employer] [place]  2 meetings at this location   Statistician         Address  Phone  Notes  [facility] [address],    [place]  [phone]   [facility]  [address], [place] [address], [place], [place] [phone]   [facility] [address] [place], [place] [phone] Admissions: 8am-3pm M-F  [facility] [address],    [place], [place] [phone]   [facility] [address] [place],  [place], [place] [phone]   [facility] [address] [place] [address],  [place], [place] [phone]   [facility] - Intensive Outpatient [address] Laurell Josephs [address], [place], [place] [phone]   [facility] (Addiction Recovery [facility] [address] [place].,  [place], [place] [phone] or [phone]   [facility] ([facility]) [address] [place], [place] [phone] Accepts Medicaid  [facility] [address]  [place] [phone] Substance Abuse/Addiction Treatment   [facility] Organization         Address  Phone  Notes  [facility]  [phone]   Angie Fava, PhD [address] Ervin Knack [place], [place]   [phone] or [phone]   [facility]   [address] [place], [place] [phone]   [facility] [address] [place], [place] [phone] Insurance/Medicaid/sponsorship through [facility] and Families [address]                                    [place], [place] [phone] Therapy/tele-psych/case  [facility] [address], [place] [phone]    Dr. Lolly Mustache  [phone]   [facility]  [employer] [place] [facility] Dept. 1) [address], [place] 2) [address] [place] 3)  [address], [place] [phone]  [phone]   [facility] Child Abuse Hotline 4056832227 or [phone] (After Hours)

## 2015-08-28 NOTE — ED Notes (Signed)
PA  at bedside for assessment/dermabond

## 2015-08-28 NOTE — ED Notes (Signed)
PA called RN to room. She states the pt was stumbling in the room against the counter and garbage can, blood noted on the floor. Pt states he was "just washing my hands" denies falling in the room.

## 2015-08-28 NOTE — ED Provider Notes (Signed)
1:20 AM Patient signed out to me by Catha Gosselin, PA-C. Patient will be observed here until sober.   5:29 AM Patient will be discharged.   [address] [place], PA-C 08/28/15 1610  Dione Booze, MD 08/28/15 [phone]

## 2015-10-20 ENCOUNTER — Emergency Department (HOSPITAL_COMMUNITY): Payer: BLUE CROSS/BLUE SHIELD

## 2015-10-20 ENCOUNTER — Encounter (HOSPITAL_COMMUNITY): Payer: Self-pay | Admitting: Emergency Medicine

## 2015-10-20 ENCOUNTER — Emergency Department (HOSPITAL_COMMUNITY)
Admission: EM | Admit: 2015-10-20 | Discharge: 2015-10-20 | Disposition: A | Discharge status: Home / Self Care | Payer: BLUE CROSS/BLUE SHIELD | Attending: Emergency Medicine | Admitting: Emergency Medicine

## 2015-10-20 DIAGNOSIS — R0789 Other chest pain: Secondary | ICD-10-CM | POA: Insufficient documentation

## 2015-10-20 DIAGNOSIS — Z87891 Personal history of nicotine dependence: Secondary | ICD-10-CM | POA: Insufficient documentation

## 2015-10-20 DIAGNOSIS — R079 Chest pain, unspecified: Secondary | ICD-10-CM | POA: Diagnosis present

## 2015-10-20 LAB — CBC WITH DIFFERENTIAL/PLATELET
BASOS ABS: 0 10*3/uL (ref 0.0–0.1)
Basophils Relative: 0 %
EOS PCT: 1 %
Eosinophils Absolute: 0.1 10*3/uL (ref 0.0–0.7)
HEMATOCRIT: 38.8 % — AB (ref 39.0–52.0)
HEMOGLOBIN: 13.3 g/dL (ref 13.0–17.0)
LYMPHS ABS: 2.4 10*3/uL (ref 0.7–4.0)
LYMPHS PCT: 52 %
MCH: 28.9 pg (ref 26.0–34.0)
MCHC: 34.3 g/dL (ref 30.0–36.0)
MCV: 84.3 fL (ref 78.0–100.0)
MONOS PCT: 7 %
Monocytes Absolute: 0.3 10*3/uL (ref 0.1–1.0)
Neutro Abs: 1.9 10*3/uL (ref 1.7–7.7)
Neutrophils Relative %: 40 %
Platelets: 220 10*3/uL (ref 150–400)
RBC: 4.6 MIL/uL (ref 4.22–5.81)
RDW: 12.5 % (ref 11.5–15.5)
WBC: 4.6 10*3/uL (ref 4.0–10.5)

## 2015-10-20 LAB — BASIC METABOLIC PANEL
Anion gap: 10 (ref 5–15)
BUN: 17 mg/dL (ref 6–20)
CALCIUM: 9.4 mg/dL (ref 8.9–10.3)
CHLORIDE: 102 mmol/L (ref 101–111)
CO2: 25 mmol/L (ref 22–32)
CREATININE: 1.02 mg/dL (ref 0.61–1.24)
GFR calc Af Amer: >60 mL/min (ref >60)
GFR calc non Af Amer: >60 mL/min (ref >60)
GLUCOSE: 96 mg/dL (ref 65–99)
Potassium: 4 mmol/L (ref 3.5–5.1)
Sodium: 137 mmol/L (ref 135–145)

## 2015-10-20 LAB — I-STAT TROPONIN, ED: Troponin i, poc: 0 ng/mL (ref 0.00–0.08)

## 2015-10-20 NOTE — ED Notes (Signed)
Pt reports new onset chest pain starting yesterday to central and left chest. Also reports it hurts worse with deep breaths and c.o. occasional shortness of breath. Pt is not short of breath at present.

## 2015-10-20 NOTE — ED Notes (Signed)
Patient is alert and orientedx4.  Patient was explained discharge instructions and they understood them with no questions.   

## 2015-10-20 NOTE — ED Provider Notes (Signed)
CSN: [id number]     Arrival date & time 10/20/15  1626 History   First MD Initiated Contact with Patient 10/20/15 1753     Chief Complaint  Patient presents with  . Chest Pain     (Consider location/radiation/quality/duration/timing/severity/associated sxs/prior Treatment) Patient is a 29 y.o. male presenting with chest pain. The history is provided by the patient.  Chest Pain Pain location:  L chest Pain quality: sharp and shooting   Pain radiates to:  Does not radiate Pain radiates to the back: no   Pain severity:  Moderate Onset quality:  Sudden Duration:  1 day Timing:  Constant Progression:  Worsening Chronicity:  New Relieved by:  Nothing Worsened by:  Movement and certain positions Ineffective treatments:  None tried Associated symptoms: no abdominal pain, no fever, no headache, no palpitations, no shortness of breath and not vomiting   Risk factors: male sex   Risk factors: no coronary artery disease, no diabetes mellitus, no high cholesterol, no hypertension and no smoking     29 yo M with a chief complaint of chest pain. The started earlier today. Patient denies any injury. Pain is sharp pinpoint worse with movement or palpation. Patient denies fevers or chills has had a cough for the past few days. Worse with cough.  History reviewed. No pertinent past medical history. Past Surgical History  Procedure Laterality Date  . Hernia repair      L inguinal   History reviewed. No pertinent family history. Social History  Substance Use Topics  . Smoking status: Former Smoker -- 0.50 packs/day    Types: Cigarettes  . Smokeless tobacco: None  . Alcohol Use: No    Review of Systems  Constitutional: Negative for fever and chills.  HENT: Negative for congestion and facial swelling.   Eyes: Negative for discharge and visual disturbance.  Respiratory: Negative for shortness of breath.   Cardiovascular: Positive for chest pain. Negative for palpitations.   Gastrointestinal: Negative for vomiting, abdominal pain and diarrhea.  Musculoskeletal: Negative for myalgias and arthralgias.  Skin: Negative for color change and rash.  Neurological: Negative for tremors, syncope and headaches.  Psychiatric/Behavioral: Negative for confusion and dysphoric mood.      Allergies  Review of patient's allergies indicates no known allergies.  Home Medications   Prior to Admission medications   Medication Sig Start Date End Date Taking? Authorizing Provider  atropine 1 % ophthalmic ointment Place 1 application into the right eye daily. Patient not taking: Reported on 10/20/2015 04/02/14   Dahlia ClientHannah Muthersbaugh, PA-C  moxifloxacin (VIGAMOX) 0.5 % ophthalmic solution Place 1 drop into the right eye 4 (four) times daily. Patient not taking: Reported on 10/20/2015 04/02/14   Dahlia ClientHannah Muthersbaugh, PA-C   BP 119/77 mmHg  Pulse 61  Temp(Src) 98.4 F (36.9 C) (Oral)  Resp 18  Ht 5\' 6"  (1.676 m)  Wt 235 lb (106.595 kg)  BMI 37.95 kg/m2  SpO2 97% Physical Exam  Constitutional: He is oriented to person, place, and time. He appears well-developed and well-nourished.  HENT:  Head: Normocephalic and atraumatic.  Eyes: EOM are normal. Pupils are equal, round, and reactive to light.  Neck: Normal range of motion. Neck supple. No JVD present.  Cardiovascular: Normal rate and regular rhythm.  Exam reveals no gallop and no friction rub.   No murmur heard. Pulmonary/Chest: No respiratory distress. He has no wheezes. He exhibits tenderness (tender palpation about the left anterior chest wall).  Abdominal: He exhibits no distension. There is no rebound and  no guarding.  Musculoskeletal: Normal range of motion.  Neurological: He is alert and oriented to person, place, and time.  Skin: No rash noted. No pallor.  Psychiatric: He has a normal mood and affect. His behavior is normal.  Nursing note and vitals reviewed.   ED Course  Procedures (including critical care  time) Labs Review Labs Reviewed  CBC WITH DIFFERENTIAL/PLATELET - Abnormal; Notable for the following:    HCT 38.8 (*)    All other components within normal limits  BASIC METABOLIC PANEL  I-STAT TROPOININ, ED    Imaging Review Dg Chest 2 View  10/20/2015  CLINICAL DATA:  Chest pain EXAM: CHEST  2 VIEW COMPARISON:  None. FINDINGS: The heart size and mediastinal contours are within normal limits. Both lungs are clear. The visualized skeletal structures are unremarkable. IMPRESSION: No active cardiopulmonary disease. Electronically Signed   By: Marlan Palau M.D.   On: 10/20/2015 17:03   I have personally reviewed and evaluated these images and lab results as part of my medical decision-making.   EKG Interpretation   Date/Time:  Tuesday October 20 2015 16:36:35 EST Ventricular Rate:  85 PR Interval:  134 QRS Duration: 86 QT Interval:  334 QTC Calculation: 397 R Axis:   64 Text Interpretation:  Normal sinus rhythm with sinus arrhythmia Normal ECG  isolated flipped t wave in lead III No old tracing to compare Confirmed by  Oluwatimileyin Vivier MD, DANIEL [phone]) on 10/20/2015 5:53:37 PM      MDM   Final diagnoses:  Chest pain, unspecified chest pain type    29 yo M with a chief complaint chest pain. Most likely musculoskeletal by history and physical. Troponin negative chest x-ray unremarkable EKG unremarkable. PERC negative.    11:19 PM:  I have discussed the diagnosis/risks/treatment options with the patient and family and believe the pt to be eligible for discharge home to follow-up with PCP. We also discussed returning to the ED immediately if new or worsening sx occur. We discussed the sx which are most concerning (e.g., sudden worsening pain, fever, inability to tolerate by mouth) that necessitate immediate return. Medications administered to the patient during their visit and any new prescriptions provided to the patient are listed below.  Medications given during this  visit Medications - No data to display  Discharge Medication List as of 10/20/2015  6:12 PM      The patient appears reasonably screen and/or stabilized for discharge and I doubt any other medical condition or other [facility] requiring further screening, evaluation, or treatment in the ED at this time prior to discharge.     Melene Plan, DO 10/20/15 [id number]

## 2015-10-20 NOTE — Discharge Instructions (Signed)
Takes Zantac up to twice a day as needed for pain. Take Tylenol and Motrin.  Take 4 over the counter ibuprofen tablets 3 times a day or 2 over-the-counter naproxen tablets twice a day for pain.  Nonspecific Chest Pain It is often hard to find the cause of chest pain. There is always a chance that your pain could be related to something serious, such as a heart attack or a blood clot in your lungs. Chest pain can also be caused by conditions that are not life-threatening. If you have chest pain, it is very important to follow up with your doctor.  HOME CARE  If you were prescribed an antibiotic medicine, finish it all even if you start to feel better.  Avoid any activities that cause chest pain.  Do not use any tobacco products, including cigarettes, chewing tobacco, or electronic cigarettes. If you need help quitting, ask your doctor.  Do not drink alcohol.  Take medicines only as told by your doctor.  Keep all follow-up visits as told by your doctor. This is important. This includes any further testing if your chest pain does not go away.  Your doctor may tell you to keep your head raised (elevated) while you sleep.  Make lifestyle changes as told by your doctor. These may include:  Getting regular exercise. Ask your doctor to suggest some activities that are safe for you.  Eating a heart-healthy diet. Your doctor or a diet specialist (dietitian) can help you to learn healthy eating options.  Maintaining a healthy weight.  Managing diabetes, if necessary.  Reducing stress. GET HELP IF:  Your chest pain does not go away, even after treatment.  You have a rash with blisters on your chest.  You have a fever. GET HELP RIGHT AWAY IF:  Your chest pain is worse.  You have an increasing cough, or you cough up blood.  You have severe belly (abdominal) pain.  You feel extremely weak.  You pass out (faint).  You have chills.  You have sudden, unexplained chest  discomfort.  You have sudden, unexplained discomfort in your arms, back, neck, or jaw.  You have shortness of breath at any time.  You suddenly start to sweat, or your skin gets clammy.  You feel nauseous.  You vomit.  You suddenly feel light-headed or dizzy.  Your heart begins to beat quickly, or it feels like it is skipping beats. These symptoms may be an emergency. Do not wait to see if the symptoms will go away. Get medical help right away. Call your local emergency services (911 in the [place] Do not drive yourself to the hospital.   This information is not intended to replace advice given to you by your health care provider. Make sure you discuss any questions you have with your health care provider.   Document Released: 05/09/2008 Document Revised: 12/12/2014 Document Reviewed: 06/27/2014 Elsevier Interactive Patient Education [employer]2016 Elsevier Inc.

## 2019-03-23 ENCOUNTER — Other Ambulatory Visit: Payer: Self-pay

## 2019-03-23 ENCOUNTER — Ambulatory Visit (HOSPITAL_COMMUNITY)
Admission: EM | Admit: 2019-03-23 | Discharge: 2019-03-23 | Disposition: A | Discharge status: Home / Self Care | Payer: 59

## 2019-03-23 DIAGNOSIS — R059 Cough, unspecified: Secondary | ICD-10-CM

## 2019-03-23 DIAGNOSIS — R05 Cough: Secondary | ICD-10-CM | POA: Diagnosis not present

## 2019-03-23 NOTE — ED Provider Notes (Signed)
[facility]    CSN: [id number] Arrival date & time: 03/23/19  1531     History   Chief Complaint Chief Complaint  Patient presents with  . Cough    HPI Cody Owens is a 33 y.o. male.   HPI  Patient presented to work today at [employer] was not allowed to report to work due to cough and his girlfriend who also is employed there had a fever along with respiratory symptoms. He associates cough with allergies. He is not experiencing nasal congestion, nasal drainage, or headache. He is has remain afebrile and denies shortness of breath.    No past medical history on file.  There are no active problems to display for this patient.   Past Surgical History:  Procedure Laterality Date  . HERNIA REPAIR     L inguinal       Home Medications    Prior to Admission medications   Medication Sig Start Date End Date Taking? Authorizing Provider  atropine 1 % ophthalmic ointment Place 1 application into the right eye daily. Patient not taking: Reported on 10/20/2015 04/02/14   Muthersbaugh, Dahlia ClientHannah, PA-C  moxifloxacin (VIGAMOX) 0.5 % ophthalmic solution Place 1 drop into the right eye 4 (four) times daily. Patient not taking: Reported on 10/20/2015 04/02/14   Muthersbaugh, Dahlia ClientHannah, PA-C    Family History No family history on file.  Social History Social History   Tobacco Use  . Smoking status: Former Smoker    Packs/day: 0.50    Types: Cigarettes  Substance Use Topics  . Alcohol use: No    Alcohol/week: 7.0 standard drinks    Types: 7 Cans of beer per week  . Drug use: No     Allergies   Patient has no known allergies.   Review of Systems Review of Systems Pertinent negatives listed in HPI Physical Exam Triage Vital Signs ED Triage Vitals  Enc Vitals Group     BP 03/23/19 1558 (!) 151/85     Pulse Rate 03/23/19 1558 72     Resp 03/23/19 1558 16     Temp 03/23/19 1558 98.3 F (36.8 C)     Temp Source 03/23/19 1558 Oral     SpO2 03/23/19  1558 97 %     Weight --      Height --      Head Circumference --      Peak Flow --      Pain Score 03/23/19 1600 0     Pain Loc --      Pain Edu? --      Excl. in GC? --    No data found.  Updated Vital Signs BP (!) 151/85 (BP Location: Right Arm)   Pulse 72   Temp 98.3 F (36.8 C) (Oral)   Resp 16   SpO2 97%   Visual Acuity Right Eye Distance:   Left Eye Distance:   Bilateral Distance:    Right Eye Near:   Left Eye Near:    Bilateral Near:     Physical Exam General appearance: alert, well developed, well nourished, cooperative and in no distress Head: Normocephalic, without obvious abnormality, atraumatic Respiratory: Respirations even and unlabored, normal respiratory rate Heart: rate and rhythm normal. No gallop or murmurs noted on exam  Extremities: No gross deformities Skin: Skin color, texture, turgor normal. No rashes seen  Psych: Appropriate mood and affect. Neurologic: Mental status: Alert, oriented to person, place, and time, thought content appropriate. UC Treatments / Results  Labs (all labs ordered are listed, but only abnormal results are displayed) Labs Reviewed - No data to display  EKG None  Radiology No results found.  Procedures Procedures (including critical care time)  Medications Ordered in UC Medications - No data to display  Initial Impression / Assessment and Plan / UC Course  I have reviewed the triage vital signs and the nursing notes.  Pertinent labs & imaging results that were available during my care of the patient were reviewed by me and considered in my medical decision making (see chart for details).     Patient was discharge from work today due to concern for COVID 19 exposure due to live-in girlfriend developed acute onset fever with respiratory symptoms. Patient was counseled on the importance of self-quarantine given the lack of availability of COVID testing. Work note provided excusing patient from work for the next 5  days, pending he remains symptom free. He is advised not to share space with significant other and to exercise social distancing. COVID CDC recommendation and discharge instructions given to patient. Patient verbalized understanding and agreement with plan. Final Clinical Impressions(s) / UC Diagnoses   Final diagnoses:  Cough   Discharge Instructions   None    ED Prescriptions    None     Controlled Substance Prescriptions [place] Controlled Substance Registry consulted? Not Applicable   Bing Neighbors, FNP 03/23/19 1642

## 2019-03-23 NOTE — ED Triage Notes (Signed)
Pt was sent home from work bc of a itchy cough, No congestion, no fevers, no chills. Pt said he believes it is allergies.

## 2019-06-15 ENCOUNTER — Emergency Department (HOSPITAL_COMMUNITY): Payer: 59

## 2019-06-15 ENCOUNTER — Encounter (HOSPITAL_COMMUNITY): Payer: Self-pay | Admitting: *Deleted

## 2019-06-15 ENCOUNTER — Emergency Department (HOSPITAL_COMMUNITY)
Admission: EM | Admit: 2019-06-15 | Discharge: 2019-06-15 | Disposition: A | Discharge status: Home / Self Care | Payer: 59 | Attending: Emergency Medicine | Admitting: Emergency Medicine

## 2019-06-15 ENCOUNTER — Other Ambulatory Visit: Payer: Self-pay

## 2019-06-15 DIAGNOSIS — U071 COVID-19: Secondary | ICD-10-CM | POA: Diagnosis not present

## 2019-06-15 DIAGNOSIS — R079 Chest pain, unspecified: Secondary | ICD-10-CM

## 2019-06-15 DIAGNOSIS — R0789 Other chest pain: Secondary | ICD-10-CM | POA: Diagnosis present

## 2019-06-15 DIAGNOSIS — F17210 Nicotine dependence, cigarettes, uncomplicated: Secondary | ICD-10-CM | POA: Diagnosis not present

## 2019-06-15 LAB — BASIC METABOLIC PANEL
Anion gap: 11 (ref 5–15)
BUN: 7 mg/dL (ref 6–20)
CO2: 23 mmol/L (ref 22–32)
Calcium: 8.6 mg/dL — ABNORMAL LOW (ref 8.9–10.3)
Chloride: 104 mmol/L (ref 98–111)
Creatinine, Ser: 0.9 mg/dL (ref 0.61–1.24)
GFR calc Af Amer: >60 mL/min (ref >60)
GFR calc non Af Amer: >60 mL/min (ref >60)
Glucose, Bld: 104 mg/dL — ABNORMAL HIGH (ref 70–99)
Potassium: 3.5 mmol/L (ref 3.5–5.1)
Sodium: 138 mmol/L (ref 135–145)

## 2019-06-15 LAB — CBC
HCT: 43.7 % (ref 39.0–52.0)
Hemoglobin: 14.7 g/dL (ref 13.0–17.0)
MCH: 29.2 pg (ref 26.0–34.0)
MCHC: 33.6 g/dL (ref 30.0–36.0)
MCV: 86.9 fL (ref 80.0–100.0)
Platelets: 196 10*3/uL (ref 150–400)
RBC: 5.03 MIL/uL (ref 4.22–5.81)
RDW: 12.9 % (ref 11.5–15.5)
WBC: 3 10*3/uL — ABNORMAL LOW (ref 4.0–10.5)
nRBC: 0 % (ref 0.0–0.2)

## 2019-06-15 LAB — TROPONIN I (HIGH SENSITIVITY)
Troponin I (High Sensitivity): 4 ng/L (ref <18)
Troponin I (High Sensitivity): 4 ng/L (ref <18)

## 2019-06-15 LAB — D-DIMER, QUANTITATIVE: D-Dimer, Quant: 0.75 ug/mL-FEU — ABNORMAL HIGH (ref 0.00–0.50)

## 2019-06-15 MED ORDER — IOHEXOL 350 MG/ML SOLN
100.0000 mL | Freq: Once | INTRAVENOUS | Status: AC | PRN
  Administered 2019-06-15: 100 mL via INTRAVENOUS

## 2019-06-15 MED ORDER — SODIUM CHLORIDE 0.9% FLUSH: 3.0000 mL | Freq: Once | INTRAVENOUS | Status: DC

## 2019-06-15 MED ORDER — LORAZEPAM 2 MG/ML IJ SOLN
1.0000 mg | Freq: Once | INTRAMUSCULAR | Status: AC
  Administered 2019-06-15: 1 mg via INTRAVENOUS
  Filled 2019-06-15: qty 1

## 2019-06-15 MED ORDER — KETOROLAC TROMETHAMINE 15 MG/ML IJ SOLN
15.0000 mg | Freq: Once | INTRAMUSCULAR | Status: AC
  Administered 2019-06-15: 15 mg via INTRAVENOUS
  Filled 2019-06-15: qty 1

## 2019-06-15 NOTE — ED Notes (Signed)
Pt alert and oriented with no acute pain distress.   To CT scan.

## 2019-06-15 NOTE — ED Triage Notes (Signed)
States he didn't feel good 2 days ago , states he slept all day yest. C/o nasal congestion, vomited x 1 yest. C/o chest pressure, worse with deep breath

## 2019-06-15 NOTE — ED Notes (Signed)
Pt on phone, talkative denies any needs at this time.

## 2019-06-15 NOTE — ED Provider Notes (Signed)
[facility]   CSN: [id number] Arrival date & time: 06/15/19  0700     History   Chief Complaint Chief Complaint  Patient presents with   Chest Pain    HPI Cody SledgeMichael Owens is a 33 y.o. male.     HPI   33yM with CP. Sharp pain in center of chest that is worse with deep inhalation. Doesn't radiate. Onset about 2d ago. He feels like it is worsening. He is not sure if he feels more short of breath or just is breathing more shallow because of the discomfort. No cough. No fever. Has had chills. No unusual lge pain or swelling. Recent car trip to [place] but was stopping and getting out with some regularity. No significant PMHx that he is aware of.   History reviewed. No pertinent past medical history.  There are no active problems to display for this patient.   Past Surgical History:  Procedure Laterality Date   HERNIA REPAIR     L inguinal        Home Medications    Prior to Admission medications   Medication Sig Start Date End Date Taking? Authorizing Provider  atropine 1 % ophthalmic ointment Place 1 application into the right eye daily. Patient not taking: Reported on 10/20/2015 04/02/14   Muthersbaugh, Dahlia ClientHannah, PA-C  moxifloxacin (VIGAMOX) 0.5 % ophthalmic solution Place 1 drop into the right eye 4 (four) times daily. Patient not taking: Reported on 10/20/2015 04/02/14   Muthersbaugh, Dahlia ClientHannah, PA-C    Family History No family history on file.  Social History Social History   Tobacco Use   Smoking status: Current Some Day Smoker    Packs/day: 0.50    Types: Cigarettes   Smokeless tobacco: Never Used  Substance Use Topics   Alcohol use: Yes    Alcohol/week: 7.0 standard drinks    Types: 7 Cans of beer per week   Drug use: No     Allergies   Patient has no known allergies.   Review of Systems Review of Systems  All systems reviewed and negative, other than as noted in HPI.  Physical Exam Updated  Vital Signs BP (!) 152/96 (BP Location: Right Arm)    Pulse 83    Temp 97.9 F (36.6 C) (Oral)    Resp 16    Ht 5\' 7"  (1.702 m)    Wt 104.3 kg    SpO2 98%    BMI 36.02 kg/m   Physical Exam Vitals signs and nursing note reviewed.  Constitutional:      General: He is not in acute distress.    Appearance: He is well-developed.  HENT:     Head: Normocephalic and atraumatic.  Eyes:     General:        Right eye: No discharge.        Left eye: No discharge.     Conjunctiva/sclera: Conjunctivae normal.  Neck:     Musculoskeletal: Neck supple.  Cardiovascular:     Rate and Rhythm: Normal rate and regular rhythm.     Heart sounds: Normal heart sounds. No murmur. No friction rub. No gallop.   Pulmonary:     Effort: Pulmonary effort is normal. No respiratory distress.     Breath sounds: Normal breath sounds.  Abdominal:     General: There is no distension.     Palpations: Abdomen is soft.     Tenderness: There is no abdominal tenderness.  Musculoskeletal:  General: No tenderness.     Comments: Lower extremities symmetric as compared to each other. No calf tenderness. Negative Homan's. No palpable cords.   Skin:    General: Skin is warm and dry.  Neurological:     Mental Status: He is alert.  Psychiatric:        Mood and Affect: Mood is anxious.        Behavior: Behavior normal.        Thought Content: Thought content normal.      ED Treatments / Results  Labs (all labs ordered are listed, but only abnormal results are displayed) Labs Reviewed  BASIC METABOLIC PANEL - Abnormal; Notable for the following components:      Result Value   Glucose, Bld 104 (*)    Calcium 8.6 (*)    All other components within normal limits  CBC - Abnormal; Notable for the following components:   WBC 3.0 (*)    All other components within normal limits  D-DIMER, QUANTITATIVE (NOT AT [facility]) - Abnormal; Notable for the following components:   D-Dimer, Quant 0.75 (*)    All other components  within normal limits  NOVEL CORONAVIRUS, NAA (HOSPITAL ORDER, SEND-OUT TO REF LAB)  TROPONIN I (HIGH SENSITIVITY)  TROPONIN I (HIGH SENSITIVITY)    EKG EKG Interpretation  Date/Time:  Saturday June 15 2019 07:05:42 EDT Ventricular Rate:  81 PR Interval:  140 QRS Duration: 78 QT Interval:  364 QTC Calculation: 422 R Axis:   67 Text Interpretation:  Normal sinus rhythm Normal ECG Confirmed by Raeford RazorKohut, Maximino Cozzolino [phone]) on 06/15/2019 7:28:18 AM   Radiology Dg Chest 2 View  Result Date: 06/15/2019 CLINICAL DATA:  States he didn't feel good 2 days ago , states he slept all day yest. C/o nasal congestion, vomited x 1 yest. C/o chest pressure EXAM: CHEST - 2 VIEW COMPARISON:  10/20/2015 FINDINGS: The heart size and mediastinal contours are within normal limits. Both lungs are clear. No pleural effusion or pneumothorax. The visualized skeletal structures are unremarkable. IMPRESSION: No active cardiopulmonary disease. Electronically Signed   By: Amie Portlandavid  Ormond M.D.   On: 06/15/2019 08:14   Ct Angio Chest Pe W And/or Wo Contrast  Result Date: 06/15/2019 CLINICAL DATA:  Chest pain EXAM: CT ANGIOGRAPHY CHEST WITH CONTRAST TECHNIQUE: Multidetector CT imaging of the chest was performed using the standard protocol during bolus administration of intravenous contrast. Multiplanar CT image reconstructions and MIPs were obtained to evaluate the vascular anatomy. CONTRAST:  100mL OMNIPAQUE IOHEXOL 350 MG/ML SOLN COMPARISON:  Chest radiograph June 15, 2019 FINDINGS: Cardiovascular: There is no demonstrable pulmonary embolus. There is no thoracic aortic aneurysm or dissection. The visualized great vessels appear unremarkable. There is no pericardial effusion or pericardial thickening. Mediastinum/Nodes: Visualized thyroid appears unremarkable. There are scattered subcentimeter mediastinal lymph nodes. There is no adenopathy by size criteria evident. No esophageal lesions are appreciable. Lungs/Pleura: There is mild  atelectatic change in the posterior lung bases and inferior lingular regions. There is no evident edema or consolidation. No pleural effusion or pleural thickening evident. Upper Abdomen: Visualized upper abdominal structures appear unremarkable. Musculoskeletal: There are no blastic or lytic bone lesions. No evident chest wall lesions. Review of the MIP images confirms the above findings. IMPRESSION: 1. No demonstrable pulmonary embolus. No thoracic aortic aneurysm or dissection. 2.  Areas of mild atelectatic change.  No edema or consolidation. 3.  No demonstrable adenopathy. Electronically Signed   By: Bretta BangWilliam  Woodruff III M.D.   On: 06/15/2019 11:14  Procedures Procedures (including critical care time)  Medications Ordered in ED Medications  sodium chloride flush (NS) 0.9 % injection 3 mL (3 mLs Intravenous Not Given 06/15/19 0759)    Initial Impression / Assessment and Plan / ED Course  I have reviewed the triage vital signs and the nursing notes.  Pertinent labs & imaging results that were available during my care of the patient were reviewed by me and considered in my medical decision making (see chart for details).      33yM with pleuritic sounding CP. Afebrile. Nontoxic. Lungs clear. CXR w/o acute abnormality. High sensitivity troponin normal. No signs/symptoms of DVT. D-dimer was elevated but subsequent CTa w/o acute abnormality. Unlikely PE, ACS, serious infection, etc. Doubt dissection. Pt concerned about COVID. Clinically I doubt. Testing sent though. Even if he does have it, he is appropriate for discharge at this time. Needs to self quarantine until resulted.    Final Clinical Impressions(s) / ED Diagnoses   Final diagnoses:  Nonspecific chest pain    ED Discharge Orders    None       Virgel Manifold, MD 06/15/19 1149

## 2019-06-15 NOTE — ED Notes (Signed)
Patient verbalizes understanding of discharge instructions. Opportunity for questioning and answers were provided. Armband removed by staff, pt discharged from ED.  

## 2019-06-17 ENCOUNTER — Ambulatory Visit: Payer: Self-pay

## 2019-06-17 LAB — NOVEL CORONAVIRUS, NAA (HOSP ORDER, SEND-OUT TO REF LAB; TAT 18-24 HRS): SARS-CoV-2, NAA: DETECTED — AB

## 2019-06-17 NOTE — Telephone Encounter (Signed)
Pt. Called this morning to verify his positive COVID 19 results. Reviewed safety points for in the home and to continue to quarantine x 10 days since beginning of symptoms. Requests a PCP. Please contact pt. About establishing with PCP. Pt. Still having cough and some shortness of breath. Will return to ED for worsening of symptoms.

## 2019-06-17 NOTE — Telephone Encounter (Signed)
I called patient @ [phone] to schedule new patient appointment. Patient voicemail is full and unavailable to leave message.

## 2019-09-28 ENCOUNTER — Other Ambulatory Visit: Payer: Self-pay

## 2019-09-28 ENCOUNTER — Encounter (HOSPITAL_COMMUNITY): Payer: Self-pay | Admitting: Emergency Medicine

## 2019-09-28 ENCOUNTER — Emergency Department (HOSPITAL_COMMUNITY)
Admission: EM | Admit: 2019-09-28 | Discharge: 2019-09-28 | Disposition: A | Discharge status: Home / Self Care | Payer: 59 | Attending: Emergency Medicine | Admitting: Emergency Medicine

## 2019-09-28 DIAGNOSIS — K5900 Constipation, unspecified: Secondary | ICD-10-CM | POA: Insufficient documentation

## 2019-09-28 DIAGNOSIS — K029 Dental caries, unspecified: Secondary | ICD-10-CM | POA: Insufficient documentation

## 2019-09-28 DIAGNOSIS — F17210 Nicotine dependence, cigarettes, uncomplicated: Secondary | ICD-10-CM | POA: Diagnosis not present

## 2019-09-28 DIAGNOSIS — M5441 Lumbago with sciatica, right side: Secondary | ICD-10-CM | POA: Diagnosis not present

## 2019-09-28 DIAGNOSIS — K0889 Other specified disorders of teeth and supporting structures: Secondary | ICD-10-CM | POA: Diagnosis present

## 2019-09-28 DIAGNOSIS — R1030 Lower abdominal pain, unspecified: Secondary | ICD-10-CM | POA: Insufficient documentation

## 2019-09-28 NOTE — Discharge Instructions (Signed)
Start Penicillin 4 times daily for one week Continue over the counter medicine as needed for dental pain Please follow up with Dr. Geralynn Ochs (dentist)  Start stretching for the back to help with pain and to strengthen

## 2019-09-28 NOTE — ED Triage Notes (Signed)
Pt. Stated, I have a chipped tooth, my back hurts and from the pain medication I take, Its made me constipated. Started a month ago , the tooth, my back last week.

## 2019-09-28 NOTE — ED Provider Notes (Signed)
[facility] EMERGENCY DEPARTMENT Provider Note   CSN: [id number] Arrival date & time: 09/28/19  1660     History   Chief Complaint Chief Complaint  Patient presents with  . Back Pain  . Dental Pain  . Constipation    HPI Cody Owens is a 33 y.o. male who presents with dental pain and back pain.  No significant past medical history.  Patient states that he has had gradually worsening dental pain for approximately 1 month.  The right lower molar is hurting and radiates to his head and his ear.  Pain is been gradually worsening.  He has been using Orajel, lidocaine, Excedrin, Bayer aspirin with mild relief.  Pain is getting worse and he states he "cannot think straight".  He denies fever or difficulty swallowing.  He has not seen a dentist in years. Today his back pain and dental pain combined made him come to the ED.  Additionally he is reporting low back pain on the right side.  It is constant and worse with movement.  He cannot get comfortable.  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  He cannot recall an injury.  He thought it was a strain however it is not getting better and thinks that might be something else.  He thought maybe it is due to constipation.  He had a bowel movement this morning but it was only a small amount.  He tried mag citrate over-the-counter which did not provide relief.  He is also having pain across his lower abdomen.  He is trying not to strain too hard because he has a history of hernia repair but feels pressure in the groin area.  Sometimes the pain radiates down his buttocks and leg.  Couple days ago he felt numbness and tingling in both of his legs.  He has been ambulate. No fever, syncope, trauma, unexplained weight loss, hx of cancer, loss of bowel/bladder function, saddle anesthesia, urinary retention, IVDU.     HPI  History reviewed. No pertinent past medical history.  There are no active  problems to display for this patient.   Past Surgical History:  Procedure Laterality Date  . HERNIA REPAIR     L inguinal        Home Medications    Prior to Admission medications   Medication Sig Start Date End Date Taking? Authorizing Provider  Phenyleph-Doxylamine-DM-APAP (TYLENOL COLD MULTI-SYMPTOM PO) Take 2 tablets by mouth 3 (three) times daily as needed (cold symptoms).    [provider]    Family History No family history on file.  Social History Social History   Tobacco Use  . Smoking status: Current Some Day Smoker    Packs/day: 0.50    Types: Cigarettes  . Smokeless tobacco: Never Used  Substance Use Topics  . Alcohol use: Yes    Alcohol/week: 7.0 standard drinks    Types: 7 Cans of beer per week  . Drug use: No     Allergies   Patient has no known allergies.   Review of Systems Review of Systems  Constitutional: Negative for fever.  HENT: Positive for dental problem.   Respiratory: Negative for shortness of breath.   Cardiovascular: Negative for chest pain.  Gastrointestinal: Positive for abdominal pain and constipation.  Musculoskeletal: Positive for back pain.     Physical Exam Updated Vital Signs Ht 5\' 6"  (1.676 m)   Wt 104.3 kg   BMI 37.12 kg/m  Physical Exam Vitals signs and nursing note reviewed.  Constitutional:      General: He is not in acute distress.    Appearance: Normal appearance. He is well-developed. He is not ill-appearing.     Comments: Calm and cooperative. NAD  HENT:     Head: Normocephalic and atraumatic.     Right Ear: Tympanic membrane normal.     Left Ear: Tympanic membrane normal.     Mouth/Throat:     Lips: Pink.     Mouth: Mucous membranes are moist.     Dentition: Dental caries present.     Pharynx: Oropharynx is clear.   Eyes:     General: No scleral icterus.       Right eye: No discharge.        Left eye: No discharge.     Conjunctiva/sclera: Conjunctivae normal.     Pupils: Pupils  are equal, round, and reactive to light.  Neck:     Musculoskeletal: Normal range of motion.  Cardiovascular:     Rate and Rhythm: Normal rate.  Pulmonary:     Effort: Pulmonary effort is normal. No respiratory distress.  Abdominal:     General: There is no distension.     Palpations: Abdomen is soft.     Tenderness: There is no abdominal tenderness.  Musculoskeletal:     Comments: Back: Inspection: No masses, deformity, or rash Palpation: There is lumbar midline spinal tenderness with right paraspinal muscle tenderness and tenderness over the right buttocks Strength: 5/5 in lower extremities and normal plantar and dorsiflexion Gait: Normal gait   Skin:    General: Skin is warm and dry.  Neurological:     Mental Status: He is alert and oriented to person, place, and time.  Psychiatric:        Behavior: Behavior normal.      ED Treatments / Results  Labs (all labs ordered are listed, but only abnormal results are displayed) Labs Reviewed - No data to display  EKG None  Radiology No results found.  Procedures Procedures (including critical care time)  Medications Ordered in ED Medications - No data to display   Initial Impression / Assessment and Plan / ED Course  I have reviewed the triage vital signs and the nursing notes.  Pertinent labs & imaging results that were available during my care of the patient were reviewed by me and considered in my medical decision making (see chart for details).  33 year old male presents with dental pain and back pain. Dental pain associated with dental caries and possible dental infection. Patient is afebrile, non toxic appearing, and swallowing secretions well. No concerning findings on exam. No obvious abscess and doubt deep space head or neck infection.  I gave patient referral to dentist and stressed the importance of dental follow up for ultimate management of dental pain. Will rx PCN  Regarding back pain. Symptoms and exam  are consistent with sciatica. There are no red flags in history or on exam. I don't think he needs imaging today. He is declining any medicine for back pain. He was encouraged to ambulate and stretch. He was advised to f/u with a PCP.   Final Clinical Impressions(s) / ED Diagnoses   Final diagnoses:  Pain, dental  Acute right-sided low back pain with right-sided sciatica  Constipation, unspecified constipation type    ED Discharge Orders    None       Bethel Born, PA-C 09/28/19 1007    Arby Barrette, MD  09/28/19 1329  

## 2019-09-29 ENCOUNTER — Other Ambulatory Visit: Payer: Self-pay

## 2019-09-29 ENCOUNTER — Emergency Department (HOSPITAL_COMMUNITY): Payer: 59

## 2019-09-29 ENCOUNTER — Encounter (HOSPITAL_COMMUNITY): Payer: Self-pay

## 2019-09-29 ENCOUNTER — Emergency Department (HOSPITAL_COMMUNITY)
Admission: EM | Admit: 2019-09-29 | Discharge: 2019-09-29 | Disposition: A | Discharge status: Home / Self Care | Payer: 59 | Attending: Emergency Medicine | Admitting: Emergency Medicine

## 2019-09-29 DIAGNOSIS — N50811 Right testicular pain: Secondary | ICD-10-CM | POA: Diagnosis not present

## 2019-09-29 DIAGNOSIS — N50812 Left testicular pain: Secondary | ICD-10-CM

## 2019-09-29 DIAGNOSIS — Z79899 Other long term (current) drug therapy: Secondary | ICD-10-CM | POA: Insufficient documentation

## 2019-09-29 DIAGNOSIS — F17210 Nicotine dependence, cigarettes, uncomplicated: Secondary | ICD-10-CM | POA: Insufficient documentation

## 2019-09-29 DIAGNOSIS — R1030 Lower abdominal pain, unspecified: Secondary | ICD-10-CM | POA: Diagnosis not present

## 2019-09-29 LAB — CBC WITH DIFFERENTIAL/PLATELET
Abs Immature Granulocytes: 0.01 10*3/uL (ref 0.00–0.07)
Basophils Absolute: 0 10*3/uL (ref 0.0–0.1)
Basophils Relative: 0 %
Eosinophils Absolute: 0 10*3/uL (ref 0.0–0.5)
Eosinophils Relative: 0 %
HCT: 40.4 % (ref 39.0–52.0)
Hemoglobin: 13.9 g/dL (ref 13.0–17.0)
Immature Granulocytes: 0 %
Lymphocytes Relative: 14 %
Lymphs Abs: 0.7 10*3/uL (ref 0.7–4.0)
MCH: 29.8 pg (ref 26.0–34.0)
MCHC: 34.4 g/dL (ref 30.0–36.0)
MCV: 86.5 fL (ref 80.0–100.0)
Monocytes Absolute: 0.4 10*3/uL (ref 0.1–1.0)
Monocytes Relative: 9 %
Neutro Abs: 3.6 10*3/uL (ref 1.7–7.7)
Neutrophils Relative %: 77 %
Platelets: 200 10*3/uL (ref 150–400)
RBC: 4.67 MIL/uL (ref 4.22–5.81)
RDW: 12.3 % (ref 11.5–15.5)
WBC: 4.6 10*3/uL (ref 4.0–10.5)
nRBC: 0 % (ref 0.0–0.2)

## 2019-09-29 LAB — URINALYSIS, ROUTINE W REFLEX MICROSCOPIC
Bacteria, UA: NONE SEEN
Bilirubin Urine: NEGATIVE
Glucose, UA: NEGATIVE mg/dL
Hgb urine dipstick: NEGATIVE
Ketones, ur: NEGATIVE mg/dL
Leukocytes,Ua: NEGATIVE
Nitrite: NEGATIVE
Protein, ur: 30 mg/dL — AB
Specific Gravity, Urine: 1.016 (ref 1.005–1.030)
pH: 5 (ref 5.0–8.0)

## 2019-09-29 LAB — COMPREHENSIVE METABOLIC PANEL
ALT: 31 U/L (ref 0–44)
AST: 24 U/L (ref 15–41)
Albumin: 4 g/dL (ref 3.5–5.0)
Alkaline Phosphatase: 76 U/L (ref 38–126)
Anion gap: 12 (ref 5–15)
BUN: 5 mg/dL — ABNORMAL LOW (ref 6–20)
CO2: 23 mmol/L (ref 22–32)
Calcium: 9.3 mg/dL (ref 8.9–10.3)
Chloride: 100 mmol/L (ref 98–111)
Creatinine, Ser: 1.03 mg/dL (ref 0.61–1.24)
GFR calc Af Amer: >60 mL/min (ref >60)
GFR calc non Af Amer: >60 mL/min (ref >60)
Glucose, Bld: 140 mg/dL — ABNORMAL HIGH (ref 70–99)
Potassium: 3.4 mmol/L — ABNORMAL LOW (ref 3.5–5.1)
Sodium: 135 mmol/L (ref 135–145)
Total Bilirubin: 0.9 mg/dL (ref 0.3–1.2)
Total Protein: 7.1 g/dL (ref 6.5–8.1)

## 2019-09-29 LAB — HIV ANTIBODY (ROUTINE TESTING W REFLEX): HIV Screen 4th Generation wRfx: NONREACTIVE

## 2019-09-29 LAB — LIPASE, BLOOD: Lipase: 33 U/L (ref 11–51)

## 2019-09-29 MED ORDER — IOHEXOL 300 MG/ML  SOLN
125.0000 mL | Freq: Once | INTRAMUSCULAR | Status: AC | PRN
  Administered 2019-09-29: 100 mL via INTRAVENOUS

## 2019-09-29 MED ORDER — MORPHINE SULFATE (PF) 4 MG/ML IV SOLN
4.0000 mg | Freq: Once | INTRAVENOUS | Status: AC
  Administered 2019-09-29: 4 mg via INTRAVENOUS
  Filled 2019-09-29: qty 1

## 2019-09-29 MED ORDER — FENTANYL CITRATE (PF) 100 MCG/2ML IJ SOLN
50.0000 ug | Freq: Once | INTRAMUSCULAR | Status: AC
  Administered 2019-09-29: 50 ug via INTRAVENOUS
  Filled 2019-09-29: qty 2

## 2019-09-29 MED ORDER — ONDANSETRON HCL 4 MG/2ML IJ SOLN
4.0000 mg | Freq: Once | INTRAMUSCULAR | Status: AC
  Administered 2019-09-29: 4 mg via INTRAVENOUS
  Filled 2019-09-29: qty 2

## 2019-09-29 NOTE — Discharge Instructions (Addendum)
Please take tylenol or motrin for any pain. You can try tums for indigestion or an over the counter proton pump inhibitor like pepcid for any further abdominal pain. Please call your PCP, number provided above, for a follow up visit within one week.

## 2019-09-29 NOTE — ED Provider Notes (Signed)
[facility] EMERGENCY DEPARTMENT Provider Note   CSN: [id number] Arrival date & time: 09/29/19  1036     History   Chief Complaint No chief complaint on file.   HPI Cody Owens is a 33 y.o. male with history of left inguinal hernia repair presents today for evaluation of acute onset, progressively worsening low back pain and abdominal pain.  Reports symptoms began with back pain 2 weeks ago but thinks that his abdominal pain is a separate issue.  Notes constant burning and sharp pain which radiates to the lower abdomen and into the groin.  He was seen and evaluated in the ED yesterday mostly for dental pain but states "I wanted to get my back pain checked out to".  He had a reassuring examination with no concern for cauda equina and so he was recommended for outpatient management and PCP follow-up.  He states that since then he has developed testicular pain, abnormal stools, and nausea that was not present previously.  He denies vomiting, fevers, or diarrhea.  He states that this morning he had a bowel movement that was "just mucus ".  Denies melena or hematochezia.  He notes urinary urgency and frequency but decreased urine output overall, denies hematuria or dysuria.  Has a low suspicion of STDs but would like to get checked.     The history is provided by the patient.    History reviewed. No pertinent past medical history.  There are no active problems to display for this patient.   Past Surgical History:  Procedure Laterality Date  . HERNIA REPAIR     L inguinal        Home Medications    Prior to Admission medications   Medication Sig Start Date End Date Taking? Authorizing Provider  acetaminophen (TYLENOL) 500 MG tablet Take 1,000 mg by mouth every 6 (six) hours as needed for mild pain.   Yes [provider]  aspirin-acetaminophen-caffeine (EXCEDRIN MIGRAINE) [phone] MG tablet Take 2 tablets by mouth every 6 (six) hours as needed for  headache.   Yes [provider]  ibuprofen (ADVIL) 200 MG tablet Take 400 mg by mouth every 6 (six) hours as needed for moderate pain.   Yes [provider]    Family History No family history on file.  Social History Social History   Tobacco Use  . Smoking status: Current Some Day Smoker    Packs/day: 0.50    Types: Cigarettes  . Smokeless tobacco: Never Used  Substance Use Topics  . Alcohol use: Yes    Alcohol/week: 7.0 standard drinks    Types: 7 Cans of beer per week  . Drug use: No     Allergies   Patient has no known allergies.   Review of Systems Review of Systems  Constitutional: Negative for chills and fever.  Respiratory: Negative for shortness of breath.   Cardiovascular: Negative for chest pain.  Gastrointestinal: Positive for abdominal pain, constipation and nausea. Negative for vomiting.  Genitourinary: Positive for frequency, testicular pain and urgency. Negative for dysuria, hematuria and scrotal swelling.  Musculoskeletal: Positive for back pain.  All other systems reviewed and are negative.    Physical Exam Updated Vital Signs BP (!) 152/102   Pulse 99   Temp 98.8 F (37.1 C) (Oral)   Resp 18   SpO2 99%   Physical Exam Vitals signs and nursing note reviewed. Exam conducted with a chaperone present.  Constitutional:      General: He is not  in acute distress.    Appearance: He is well-developed.  HENT:     Head: Normocephalic and atraumatic.  Eyes:     General:        Right eye: No discharge.        Left eye: No discharge.     Conjunctiva/sclera: Conjunctivae normal.  Neck:     Vascular: No JVD.     Trachea: No tracheal deviation.  Cardiovascular:     Rate and Rhythm: Normal rate and regular rhythm.  Pulmonary:     Effort: Pulmonary effort is normal.  Abdominal:     General: Abdomen is protuberant. Bowel sounds are normal. There is no distension.     Palpations: Abdomen is soft.     Tenderness: There is abdominal  tenderness in the right upper quadrant, right lower quadrant, suprapubic area and left lower quadrant. There is no right CVA tenderness, guarding or rebound. Negative signs include Murphy's sign and Rovsing's sign.  Genitourinary:    Pubic Area: No rash.      Penis: Normal. No tenderness, discharge or swelling.      Scrotum/Testes: Cremasteric reflex is present.        Right: Tenderness present. Swelling not present.        Left: Tenderness present. Swelling not present.     Comments: Mild fullness to the right groin region Musculoskeletal:     Comments: Diffuse midline lumbar spine tenderness with right paralumbar muscle tenderness.  No focal spine tenderness.  No deformity, crepitus, or step-off.  5/5 strength of BLE major muscle groups.  Moves all extremities spontaneously without difficulty.  Lymphadenopathy:     Lower Body: No right inguinal adenopathy. No left inguinal adenopathy.  Skin:    General: Skin is warm and dry.     Findings: No erythema.  Neurological:     Mental Status: He is alert.  Psychiatric:        Behavior: Behavior normal.      ED Treatments / Results  Labs (all labs ordered are listed, but only abnormal results are displayed) Labs Reviewed  URINALYSIS, ROUTINE W REFLEX MICROSCOPIC - Abnormal; Notable for the following components:      Result Value   Protein, ur 30 (*)    All other components within normal limits  COMPREHENSIVE METABOLIC PANEL - Abnormal; Notable for the following components:   Potassium 3.4 (*)    Glucose, Bld 140 (*)    BUN 5 (*)    All other components within normal limits  CBC WITH DIFFERENTIAL/PLATELET  LIPASE, BLOOD  HIV ANTIBODY (ROUTINE TESTING W REFLEX)  RPR  GC/CHLAMYDIA PROBE AMP ([place]    EKG None  Radiology No results found.  Procedures Procedures (including critical care time)  Medications Ordered in ED Medications  fentaNYL (SUBLIMAZE) injection 50 mcg (has no administration in time range)   iohexol (OMNIPAQUE) 300 MG/ML solution 125 mL (has no administration in time range)  morphine 4 MG/ML injection 4 mg (4 mg Intravenous Given 09/29/19 1359)  ondansetron (ZOFRAN) injection 4 mg (4 mg Intravenous Given 09/29/19 1359)     Initial Impression / Assessment and Plan / ED Course  I have reviewed the triage vital signs and the nursing notes.  Pertinent labs & imaging results that were available during my care of the patient were reviewed by me and considered in my medical decision making (see chart for details).        Patient presenting for evaluation of lower abdominal pain  for a few days and bilateral testicular pain beginning yesterday.  Was seen and evaluated in the emergency department yesterday for dental pain and low back pain but reports pain in the abdomen is worsening and he developed testicular pain yesterday evening as well as nausea and mucoid stools.  He is afebrile, somewhat hypertensive in the ED.  Vital signs otherwise stable.  He is nontoxic in appearance.  No peritoneal signs on examination of the abdomen.  No concern for testicular torsion on examination today.  Will obtain lab work, CT abdomen pelvis and scrotal ultrasound for further evaluation.  Lab work reviewed by me shows no leukocytosis, no anemia, no metabolic derangements, no renal insufficiency.  UA does not suggest UTI or nephrolithiasis.  He reports he has low risk sexual behaviors but would like to be checked for STDs.  On reassessment after administration of pain medicine and nausea medicine he is resting comfortably no apparent distress, reports he is feeling much better.  4:00PM Signed out to oncoming provider Dr. Sherril Croon.  Patient pending CT scan and scrotal ultrasound.  If no surgical abdominal pathology he will likely be stable for discharge home with outpatient follow-up and management of his pain.  He has no red flag signs concerning for cauda equina or spinal abscess.  Doubt dissection.  Will  hold off on treatment of STDs at this time unless ultrasound shows concern for epididymitis. Final Clinical Impressions(s) / ED Diagnoses   Final diagnoses:  Lower abdominal pain  Pain in both testicles    ED Discharge Orders    None       Renita Papa, PA-C 09/29/19 1608    Maudie Flakes, MD 10/01/19 [phone]

## 2019-09-29 NOTE — ED Provider Notes (Signed)
  Physical Exam  BP (!) 152/102   Pulse 99   Temp 98.8 F (37.1 C) (Oral)   Resp 18   SpO2 99%   Physical Exam Vitals signs and nursing note reviewed.  Constitutional:      Appearance: He is well-developed.     Comments: Sleeping comfortably on my reassessment  HENT:     Head: Normocephalic and atraumatic.  Eyes:     Conjunctiva/sclera: Conjunctivae normal.  Neck:     Musculoskeletal: Neck supple.  Cardiovascular:     Rate and Rhythm: Normal rate and regular rhythm.     Heart sounds: No murmur.  Pulmonary:     Effort: Pulmonary effort is normal. No respiratory distress.  Abdominal:     Palpations: Abdomen is soft.     Tenderness: There is no abdominal tenderness. There is no guarding or rebound.     Comments: No peritoneal signs of the abdomen currently  Skin:    General: Skin is warm and dry.  Neurological:     Mental Status: He is alert.     ED Course/Procedures     Procedures  MDM  Patient is reassessed at the time of handoff. Imaging and labs have resulted without any acute finding for his symptoms. This was discussed with him, and how he should take an OTC PPI or tums for possible indigestion. He does not have a history of nephrolithiasis, although the possibility of a small stone causing his symptoms was discussed (not seen on CT scan considering contrasted study). He is recommended to follow up with his PCP and to take tylenol or motrin for further pain. All questions were answered. Care of patient was discussed with the supervising attending.       Julianne Rice, MD 09/29/19 1740    Blanchie Dessert, MD 09/29/19 2333

## 2019-09-29 NOTE — ED Triage Notes (Signed)
Patient complains of right side and flank pain since Friday. States this am he noted mucus when he attempted to have bowel movement. Denies dysuria with urination

## 2019-09-30 LAB — RPR: RPR Ser Ql: NONREACTIVE

## 2019-10-01 LAB — GC/CHLAMYDIA PROBE AMP (~~LOC~~) NOT AT ARMC
Chlamydia: NEGATIVE
Neisseria Gonorrhea: NEGATIVE

## 2019-12-28 IMAGING — CT CT ABD-PELV W/ CM
2 of 4 series · 16 of 46 positions shown, 18 images · IV contrast (APPLIED)
Comparison: None.

CLINICAL DATA: Right abdominal and flank pain for several days.
Passage of mucus through rectum.

EXAM:
CT ABDOMEN AND PELVIS WITH CONTRAST
TECHNIQUE: Multidetector CT imaging of the abdomen and pelvis was performed
using the standard protocol following bolus administration of
intravenous contrast.
CONTRAST:  100mL OMNIPAQUE IOHEXOL 300 MG/ML  SOLN

[Series 3: abd/ pelvis 5.0 i30f 2 · axial · 0.92mm/px · z∈[+664,+1164]mm · 13 of 110 slices shown, 15 images]
[im 5/110  soft-tissue]
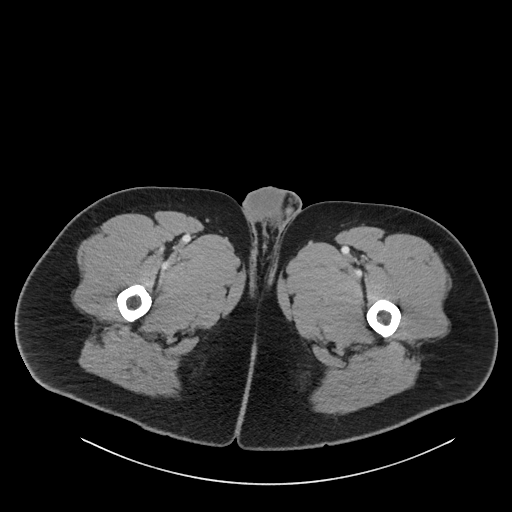
[im 5/110  bone]
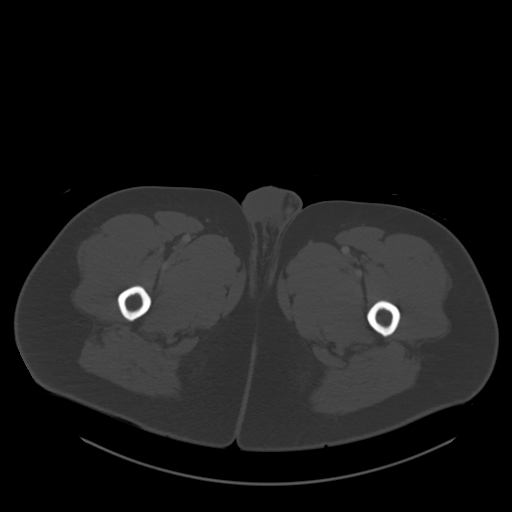
[im 14/110  soft-tissue]
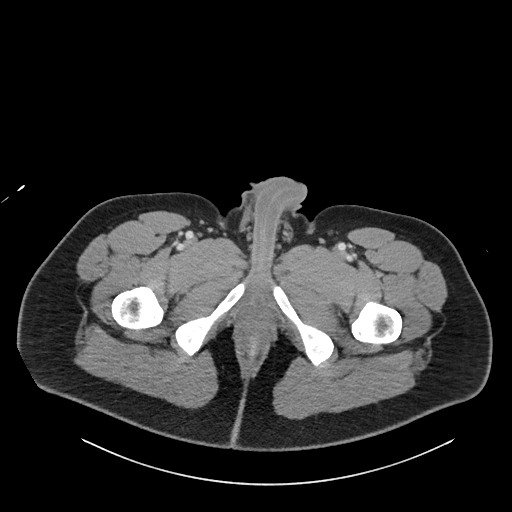
[im 22/110  soft-tissue]
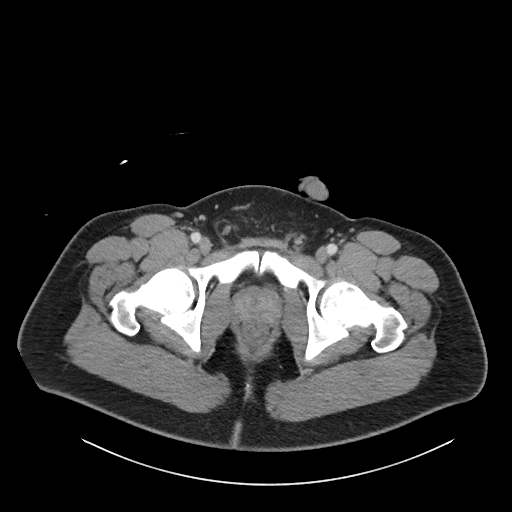
[im 31/110  soft-tissue]
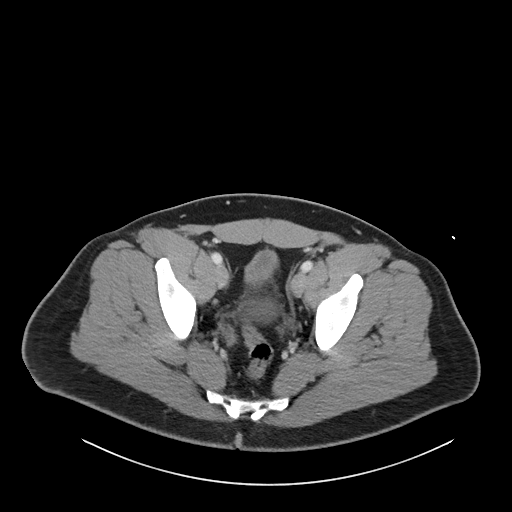
[im 40/110  soft-tissue]
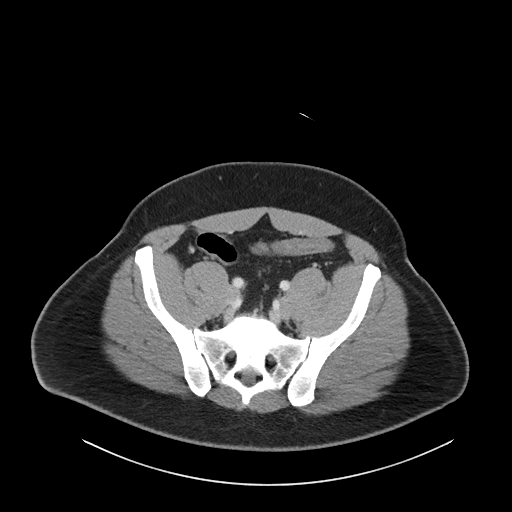
[im 48/110  soft-tissue]
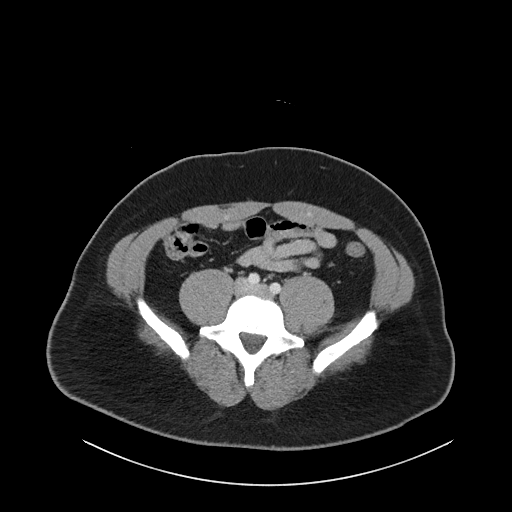
[im 57/110  soft-tissue]
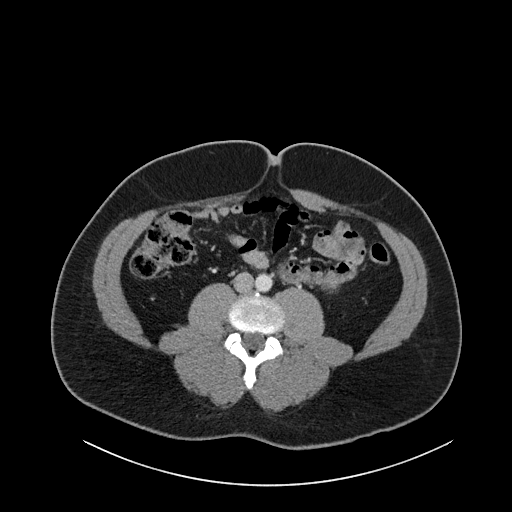
[im 62/110  soft-tissue]
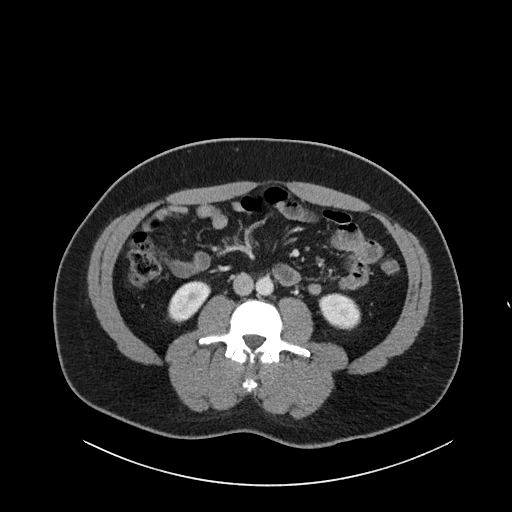
[im 70/110  soft-tissue]
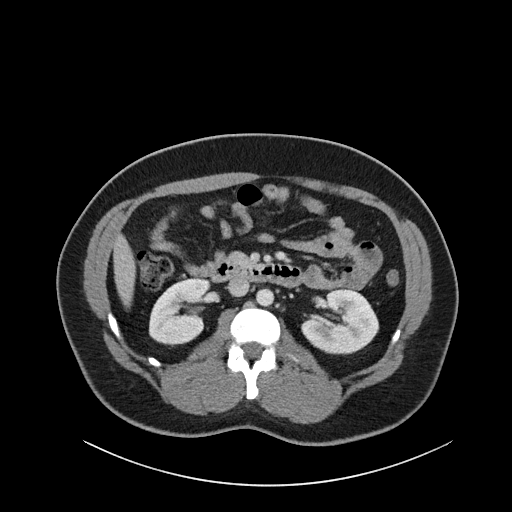
[im 70/110  bone]
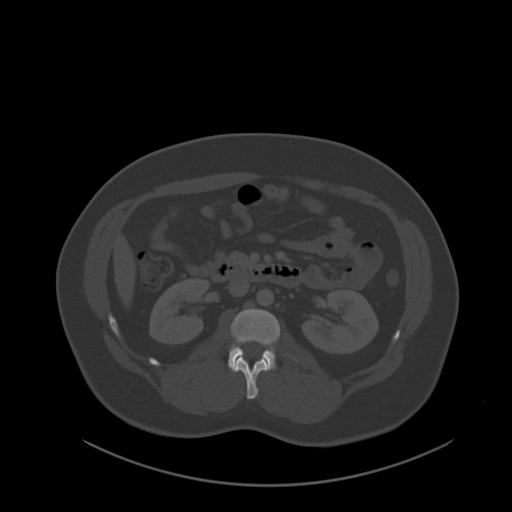
[im 79/110  soft-tissue]
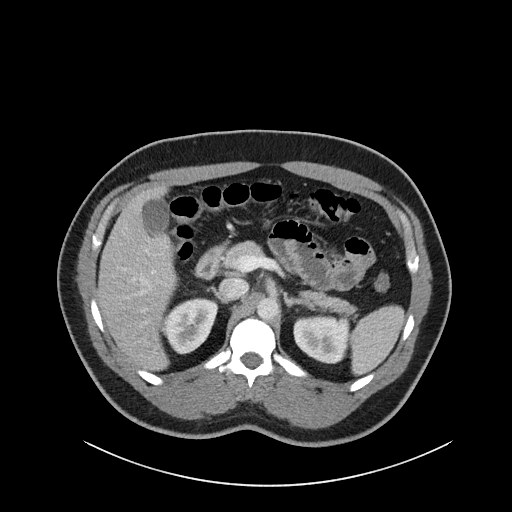
[im 88/110  soft-tissue]
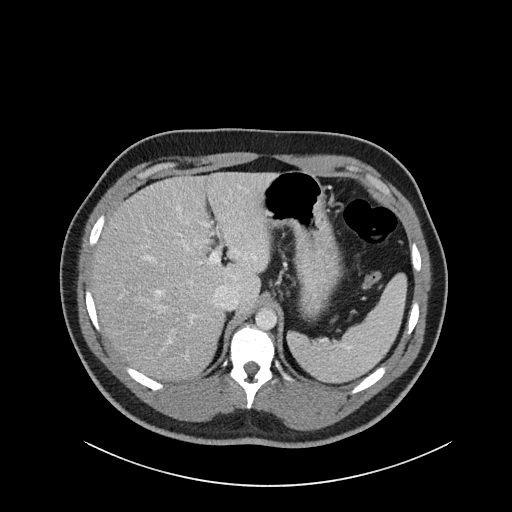
[im 96/110  soft-tissue]
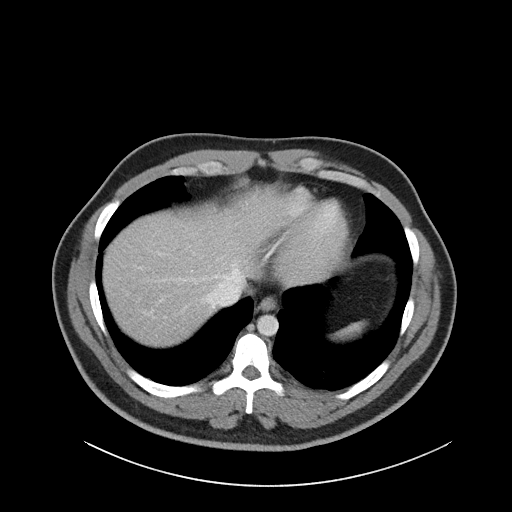
[im 105/110  soft-tissue]
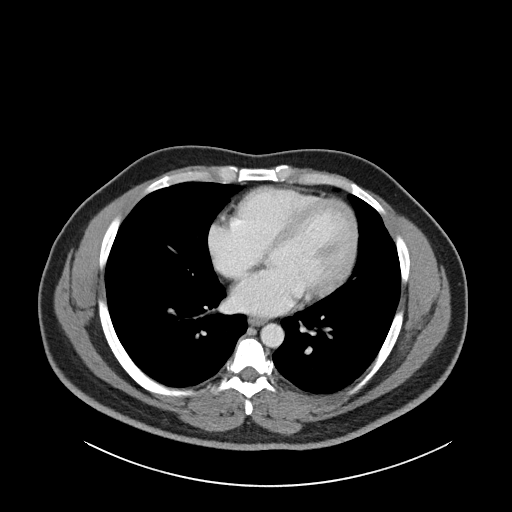

[Series 6: coronal soft tissue · coronal · 0.81mm/px · 3 of 103 slices shown]
[im 35/103  soft-tissue]
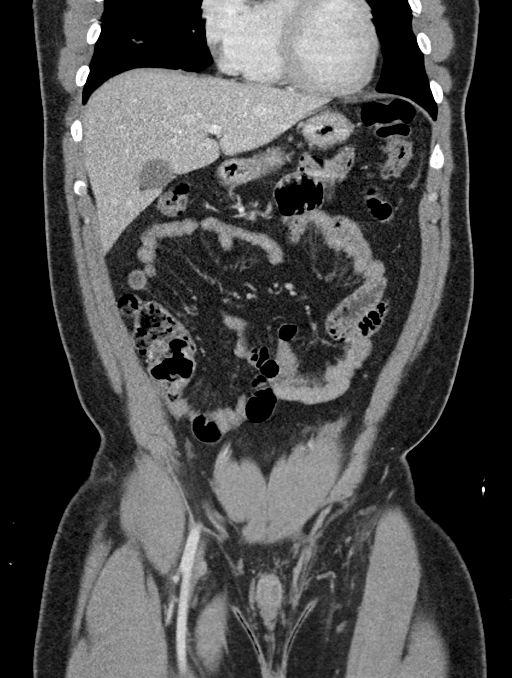
[im 46/103  soft-tissue]
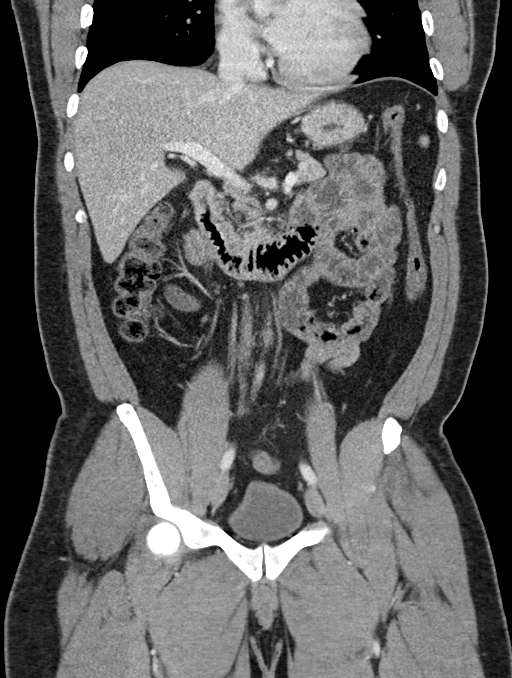
[im 57/103  soft-tissue]
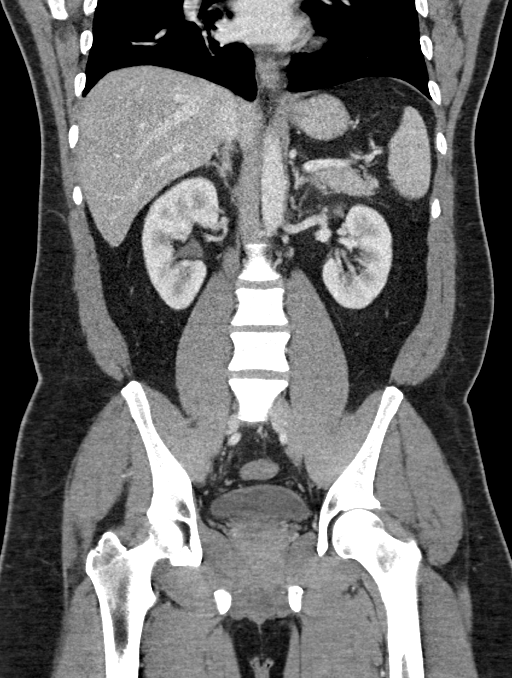

[16 of 46 positions shown; findings below may reference images not displayed]

FINDINGS: Lower chest: No significant pulmonary nodules or acute consolidative
airspace disease.

Hepatobiliary: Normal liver size. No liver mass. Normal gallbladder
with no radiopaque cholelithiasis. No biliary ductal dilatation.

Pancreas: Normal, with no mass or duct dilation.

Spleen: Normal size. No mass.

Adrenals/Urinary Tract: Normal adrenals. Normal kidneys with no
hydronephrosis and no renal mass. Normal bladder.

Stomach/Bowel: Normal non-distended stomach. Normal caliber small
bowel with no small bowel wall thickening. Normal appendix. Normal
large bowel with no diverticulosis, large bowel wall thickening or
pericolonic fat stranding.

Vascular/Lymphatic: Normal caliber abdominal aorta. Patent portal,
splenic, hepatic and renal veins. No pathologically enlarged lymph
nodes in the abdomen or pelvis.

Reproductive: Normal size prostate.

Other: No pneumoperitoneum, ascites or focal fluid collection. No
evidence of inguinal hernia.

Musculoskeletal: No aggressive appearing focal osseous lesions.
IMPRESSION: No acute abnormality. No evidence of bowel obstruction or acute
bowel inflammation. Normal appendix.

## 2020-07-28 ENCOUNTER — Other Ambulatory Visit: Payer: Self-pay

## 2020-07-28 ENCOUNTER — Ambulatory Visit
Admission: EM | Admit: 2020-07-28 | Discharge: 2020-07-28 | Disposition: A | Discharge status: Home / Self Care | Payer: 59 | Attending: Family Medicine | Admitting: Family Medicine

## 2020-07-28 DIAGNOSIS — S39012A Strain of muscle, fascia and tendon of lower back, initial encounter: Secondary | ICD-10-CM | POA: Insufficient documentation

## 2020-07-28 DIAGNOSIS — M5431 Sciatica, right side: Secondary | ICD-10-CM | POA: Diagnosis not present

## 2020-07-28 LAB — URINALYSIS, COMPLETE (UACMP) WITH MICROSCOPIC
Bacteria, UA: NONE SEEN
Bilirubin Urine: NEGATIVE
Glucose, UA: NEGATIVE mg/dL
Hgb urine dipstick: NEGATIVE
Ketones, ur: NEGATIVE mg/dL
Leukocytes,Ua: NEGATIVE
Nitrite: NEGATIVE
Protein, ur: NEGATIVE mg/dL
Specific Gravity, Urine: 1.025 (ref 1.005–1.030)
pH: 6 (ref 5.0–8.0)

## 2020-07-28 MED ORDER — METAXALONE 800 MG PO TABS: 800.0000 mg | ORAL_TABLET | Freq: Three times a day (TID) | ORAL | 0 refills | Status: AC

## 2020-07-28 MED ORDER — MELOXICAM 15 MG PO TABS: 15.0000 mg | ORAL_TABLET | Freq: Every day | ORAL | 0 refills | Status: AC

## 2020-07-28 MED ORDER — LIDOCAINE 5 % EX PTCH: 1.0000 | MEDICATED_PATCH | CUTANEOUS | 0 refills | Status: AC

## 2020-07-28 NOTE — ED Provider Notes (Signed)
[facility]    CSN: [id number] Arrival date & time: 07/28/20  0801      History   Chief Complaint Chief Complaint  Patient presents with  . Back Pain    HPI Cody Owens is a 34 y.o. male.   HPI  34 year old male presents with sharp stabbing pain that radiates from his lower sacral area into the the right groin and down the leg sciatic distribution.  The pain started on Friday.  He does not remember any specific injury that may have caused it.  He denies any bowel or bladder dysfunction.  Had no alteration of his urinary stream or any discoloration of his urine.  No history of kidney stones although he does have family members that have had kidney stones.  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  He states that movement exacerbates the pain as does walking seems to irritate him more.  He has used ibuprofen without much success.        History reviewed. No pertinent past medical history.  There are no problems to display for this patient.   Past Surgical History:  Procedure Laterality Date  . HERNIA REPAIR     L inguinal       Home Medications    Prior to Admission medications   Medication Sig Start Date End Date Taking? Authorizing Provider  acetaminophen (TYLENOL) 500 MG tablet Take 1,000 mg by mouth every 6 (six) hours as needed for mild pain.   Yes [provider]  aspirin-acetaminophen-caffeine (EXCEDRIN MIGRAINE) [phone] MG tablet Take 2 tablets by mouth every 6 (six) hours as needed for headache.   Yes [provider]  ibuprofen (ADVIL) 200 MG tablet Take 400 mg by mouth every 6 (six) hours as needed for moderate pain.   Yes [provider]  lidocaine (LIDODERM) 5 % Place 1 patch onto the skin daily. Remove & Discard patch within 12 hours or as directed by MD 07/28/20   Lutricia Feil, PA-C  meloxicam (MOBIC) 15 MG  tablet Take 1 tablet (15 mg total) by mouth daily. Take with food. 07/28/20   Lutricia Feil, PA-C  metaxalone (SKELAXIN) 800 MG tablet Take 1 tablet (800 mg total) by mouth 3 (three) times daily. 07/28/20   Lutricia Feil, PA-C    Family History History reviewed. No pertinent family history.  Social History Social History   Tobacco Use  . Smoking status: Current Some Day Smoker    Packs/day: 0.50    Types: Cigarettes  . Smokeless tobacco: Never Used  Substance Use Topics  . Alcohol use: Yes    Alcohol/week: 7.0 standard drinks    Types: 7 Cans of beer per week  . Drug use: No     Allergies   Patient has no known allergies.   Review of Systems Review of Systems  Constitutional: Positive for activity change. Negative for appetite change, chills, diaphoresis, fatigue and fever.  Musculoskeletal: Positive for back pain and myalgias.  All other systems reviewed and are negative.    Physical Exam Triage Vital Signs ED Triage Vitals  Enc Vitals Group     BP 07/28/20 0823 140/87     Pulse Rate 07/28/20 0823 (!) 55     Resp 07/28/20 0823 18     Temp 07/28/20 0823 98.3 F (36.8 C)     Temp Source 07/28/20  1941 Oral     SpO2 07/28/20 0823 100 %     Weight 07/28/20 0825 220 lb (99.8 kg)     Height 07/28/20 0825 5\' 6"  (1.676 m)     Head Circumference --      Peak Flow --      Pain Score 07/28/20 0825 7     Pain Loc --      Pain Edu? --      Excl. in GC? --    No data found.  Updated Vital Signs BP 140/87 (BP Location: Right Arm)   Pulse (!) 55   Temp 98.3 F (36.8 C) (Oral)   Resp 18   Ht 5\' 6"  (1.676 m)   Wt 220 lb (99.8 kg)   SpO2 100%   BMI 35.51 kg/m   Visual Acuity Right Eye Distance:   Left Eye Distance:   Bilateral Distance:    Right Eye Near:   Left Eye Near:    Bilateral Near:     Physical Exam Vitals and nursing note reviewed.  Constitutional:      General: He is not in acute distress.    Appearance: Normal appearance. He is not  ill-appearing or toxic-appearing.  HENT:     Head: Normocephalic and atraumatic.     Nose: Nose normal.  Eyes:     Conjunctiva/sclera: Conjunctivae normal.  Musculoskeletal:        General: Tenderness present.     Cervical back: Normal range of motion and neck supple.     Comments: Examination of the lumbar spine shows a flattening of the lower lordotic curve.  There is tenderness over the lower lumbar segment at the sacral junction more to the right than left and extends over to the right sacroiliac joint.  He does have right-sided notch tenderness.  He is able to forward flex with a level of hands at mid lower leg but returning to upright posture is bothersome.  He is able to toe and heel walk.  Sensation is intact in his lower extremities.  DTRs are bilaterally equal and symmetrical.  Straight leg raise testing on the left reveals across positive component to the right sacroiliac area and down the right leg.  I suspect her leg raise testing on the right is similar without the cross positive component at 45 degrees in the sitting position.  Skin:    General: Skin is warm and dry.  Neurological:     General: No focal deficit present.     Mental Status: He is alert and oriented to person, place, and time.  Psychiatric:        Mood and Affect: Mood normal.        Behavior: Behavior normal.        Thought Content: Thought content normal.        Judgment: Judgment normal.      UC Treatments / Results  Labs (all labs ordered are listed, but only abnormal results are displayed) Labs Reviewed  URINALYSIS, COMPLETE (UACMP) WITH MICROSCOPIC    EKG   Radiology No results found.  Procedures Procedures (including critical care time)  Medications Ordered in UC Medications - No data to display  Initial Impression / Assessment and Plan / UC Course  I have reviewed the triage vital signs and the nursing notes.  Pertinent labs & imaging results that were available during my care of the  patient were reviewed by me and considered in my medical decision making (see chart for details).  34 year old male presents with the above complaints.  Examination was consistent with a strain of the lumbar region with sciatic symptoms on the right side.  Physical exam consistent with a sciatica with no neurological findings of significance.  Will be treated symptomatically.  He was prescribed with lidocaine patches.  He may apply cold packs as necessary for comfort.  I placed him on meloxicam with caution not to take additional ibuprofen at the same time.  Placed him on muscle relaxers with appropriate precautions.  If is not improving he should follow-up with his primary care physician. Final Clinical Impressions(s) / UC Diagnoses   Final diagnoses:  Strain of lumbar region, initial encounter  Sciatica of right side     Discharge Instructions     Avoid prolonged sitting lifting or bending until your back pain has subsided.  Use lidocaine patches as prescribed.  May apply cold initially to the area of discomfort and may then switch to or alternate between hot and cold as necessary.  Uses for 20 minutes every 2 hours 4-5 times daily.  Do not take additional ibuprofen while taking the meloxicam.  If you are not improving he may return to our clinic or follow-up with your primary care physician.    ED Prescriptions    Medication Sig Dispense Auth. Provider   lidocaine (LIDODERM) 5 % Place 1 patch onto the skin daily. Remove & Discard patch within 12 hours or as directed by MD 10 patch Lutricia Feil, PA-C   meloxicam (MOBIC) 15 MG tablet Take 1 tablet (15 mg total) by mouth daily. Take with food. 30 tablet Lutricia Feil, PA-C   metaxalone (SKELAXIN) 800 MG tablet Take 1 tablet (800 mg total) by mouth 3 (three) times daily. 21 tablet Lutricia Feil, PA-C     I have reviewed the PDMP during this encounter.   Lutricia Feil, PA-C 07/28/20 1732

## 2020-07-28 NOTE — Discharge Instructions (Addendum)
Avoid prolonged sitting lifting or bending until your back pain has subsided.  Use lidocaine patches as prescribed.  May apply cold initially to the area of discomfort and may then switch to or alternate between hot and cold as necessary.  Uses for 20 minutes every 2 hours 4-5 times daily.  Do not take additional ibuprofen while taking the meloxicam.  If you are not improving he may return to our clinic or follow-up with your primary care physician.

## 2020-07-28 NOTE — ED Triage Notes (Signed)
Patient in today w/ c/o sharp, stabbing pain that radiates from the right side of his groin to the lower back. Patient states it started on Friday and NKI. Patient also states he has not been able to sleep the past couple nights because the pain is so intense.

## 2021-06-20 ENCOUNTER — Emergency Department (HOSPITAL_BASED_OUTPATIENT_CLINIC_OR_DEPARTMENT_OTHER)
Admission: EM | Admit: 2021-06-20 | Discharge: 2021-06-20 | Disposition: A | Discharge status: Home / Self Care | Payer: 59 | Attending: Emergency Medicine | Admitting: Emergency Medicine

## 2021-06-20 ENCOUNTER — Other Ambulatory Visit: Payer: Self-pay

## 2021-06-20 ENCOUNTER — Emergency Department (HOSPITAL_BASED_OUTPATIENT_CLINIC_OR_DEPARTMENT_OTHER): Payer: 59

## 2021-06-20 ENCOUNTER — Encounter (HOSPITAL_BASED_OUTPATIENT_CLINIC_OR_DEPARTMENT_OTHER): Payer: Self-pay

## 2021-06-20 DIAGNOSIS — J069 Acute upper respiratory infection, unspecified: Secondary | ICD-10-CM | POA: Insufficient documentation

## 2021-06-20 DIAGNOSIS — Z20822 Contact with and (suspected) exposure to covid-19: Secondary | ICD-10-CM | POA: Diagnosis not present

## 2021-06-20 DIAGNOSIS — R059 Cough, unspecified: Secondary | ICD-10-CM | POA: Diagnosis present

## 2021-06-20 DIAGNOSIS — F17210 Nicotine dependence, cigarettes, uncomplicated: Secondary | ICD-10-CM | POA: Diagnosis not present

## 2021-06-20 LAB — RESP PANEL BY RT-PCR (FLU A&B, COVID) ARPGX2
Influenza A by PCR: NEGATIVE
Influenza B by PCR: NEGATIVE
SARS Coronavirus 2 by RT PCR: NEGATIVE

## 2021-06-20 NOTE — ED Provider Notes (Addendum)
[facility] EMERGENCY DEPT Provider Note   CSN: [id number] Arrival date & time: 06/20/21  1614     History Chief Complaint  Patient presents with   URI    Cody Owens is a 35 y.o. male.  HPI 35 year old male presents with cough, chest pain, shortness of breath.  The symptoms have been for about 3 days.  Feels like his chest has congestion stuck in it.  He has had low-grade fever of 99.  Today when he was asleep his wife told him that he was wheezing.  He otherwise took his son's albuterol inhaler and it seemed to partially help.  He has had a headache and sore throat as well.  History reviewed. No pertinent past medical history.  There are no problems to display for this patient.   Past Surgical History:  Procedure Laterality Date   HERNIA REPAIR     L inguinal       No family history on file.  Social History   Tobacco Use   Smoking status: Some Days    Packs/day: 0.50    Types: Cigarettes   Smokeless tobacco: Never  Substance Use Topics   Alcohol use: Yes    Alcohol/week: 7.0 standard drinks    Types: 7 Cans of beer per week   Drug use: No    Home Medications Prior to Admission medications   Medication Sig Start Date End Date Taking? Authorizing Provider  acetaminophen (TYLENOL) 500 MG tablet Take 1,000 mg by mouth every 6 (six) hours as needed for mild pain.    [provider]  aspirin-acetaminophen-caffeine (EXCEDRIN MIGRAINE) [phone] MG tablet Take 2 tablets by mouth every 6 (six) hours as needed for headache.    [provider]  ibuprofen (ADVIL) 200 MG tablet Take 400 mg by mouth every 6 (six) hours as needed for moderate pain.    [provider]  lidocaine (LIDODERM) 5 % Place 1 patch onto the skin daily. Remove & Discard patch within 12 hours or as directed by MD 07/28/20   Lutricia Feil, PA-C  meloxicam (MOBIC) 15 MG tablet Take 1 tablet (15 mg total) by mouth daily. Take with food. 07/28/20   Lutricia Feil, PA-C  metaxalone (SKELAXIN) 800 MG tablet Take 1 tablet (800 mg total) by mouth 3 (three) times daily. 07/28/20   Lutricia Feil, PA-C    Allergies    Patient has no known allergies.  Review of Systems   Review of Systems  Constitutional:  Positive for fever.  HENT:  Positive for congestion and sore throat.   Respiratory:  Positive for cough, shortness of breath and wheezing.   Cardiovascular:  Positive for chest pain (with cough only).  Neurological:  Positive for headaches.  All other systems reviewed and are negative.  Physical Exam Updated Vital Signs BP (!) 166/90 (BP Location: Left Arm)   Pulse 78   Temp 99.4 F (37.4 C) (Oral)   Resp 20   SpO2 100%   Physical Exam Vitals and nursing note reviewed.  Constitutional:      General: He is not in acute distress.    Appearance: He is well-developed. He is not ill-appearing or diaphoretic.  HENT:     Head: Normocephalic and atraumatic.     Right Ear: External ear normal.     Left Ear: External ear normal.     Nose: Nose normal.     Mouth/Throat:     Pharynx: No oropharyngeal exudate or posterior oropharyngeal erythema.  Eyes:     General:        Right eye: No discharge.        Left eye: No discharge.  Cardiovascular:     Rate and Rhythm: Normal rate and regular rhythm.     Heart sounds: Normal heart sounds.  Pulmonary:     Effort: Pulmonary effort is normal. No tachypnea, accessory muscle usage or respiratory distress.     Breath sounds: Normal breath sounds. No wheezing or rales.  Abdominal:     Palpations: Abdomen is soft.     Tenderness: There is no abdominal tenderness.  Musculoskeletal:     Cervical back: Neck supple.  Skin:    General: Skin is warm and dry.  Neurological:     Mental Status: He is alert.  Psychiatric:        Mood and Affect: Mood is not anxious.    ED Results / Procedures / Treatments   Labs (all labs ordered are listed, but only abnormal results are displayed) Labs  Reviewed  RESP PANEL BY RT-PCR (FLU A&B, COVID) ARPGX2    EKG None  Radiology DG Chest Portable 1 View  Result Date: 06/20/2021 CLINICAL DATA:  Cough congestion EXAM: PORTABLE CHEST 1 VIEW COMPARISON:  06/15/2019 FINDINGS: The heart size and mediastinal contours are within normal limits. Both lungs are clear. The visualized skeletal structures are unremarkable. IMPRESSION: No active disease. Electronically Signed   By: Jasmine Pang M.D.   On: 06/20/2021 18:01    Procedures Procedures   Medications Ordered in ED Medications - No data to display  ED Course  I have reviewed the triage vital signs and the nursing notes.  Pertinent labs & imaging results that were available during my care of the patient were reviewed by me and considered in my medical decision making (see chart for details).    MDM Rules/Calculators/A&P                          Patient's chest x-ray is unremarkable.  This has been personally reviewed.  No bacterial pneumonia or other obvious bacterial infection.  Likely either COVID versus other viral URI.  COVID testing is pending but he would not be interested in antiviral treatments at this time.  Otherwise he has high blood pressure which she will be made aware he needs to follow-up with a PCP to get rechecked.  He is stable for discharge home with return precautions.  I offered him albuterol given his feeling that he has had some wheezing though there is no wheezing here but he declines. Final Clinical Impression(s) / ED Diagnoses Final diagnoses:  Viral upper respiratory infection  Suspected COVID-19 virus infection    Rx / DC Orders ED Discharge Orders     None        Pricilla Loveless, MD 06/20/21 1818    Pricilla Loveless, MD 06/20/21 1819

## 2021-06-20 NOTE — Discharge Instructions (Addendum)
If you develop high fever, severe cough or cough with blood, trouble breathing, severe headache, neck pain/stiffness, vomiting, or any other new/concerning symptoms then return to the ER for evaluation  

## 2021-06-21 NOTE — ED Notes (Signed)
Opened chart to check to see if dispo was completed.

## 2021-09-18 IMAGING — DX DG CHEST 1V PORT
1 series · 1 of 1 positions shown · non-contrast
Comparison: 06/15/2019

CLINICAL DATA: Cough congestion

EXAM:
PORTABLE CHEST 1 VIEW

[chest]
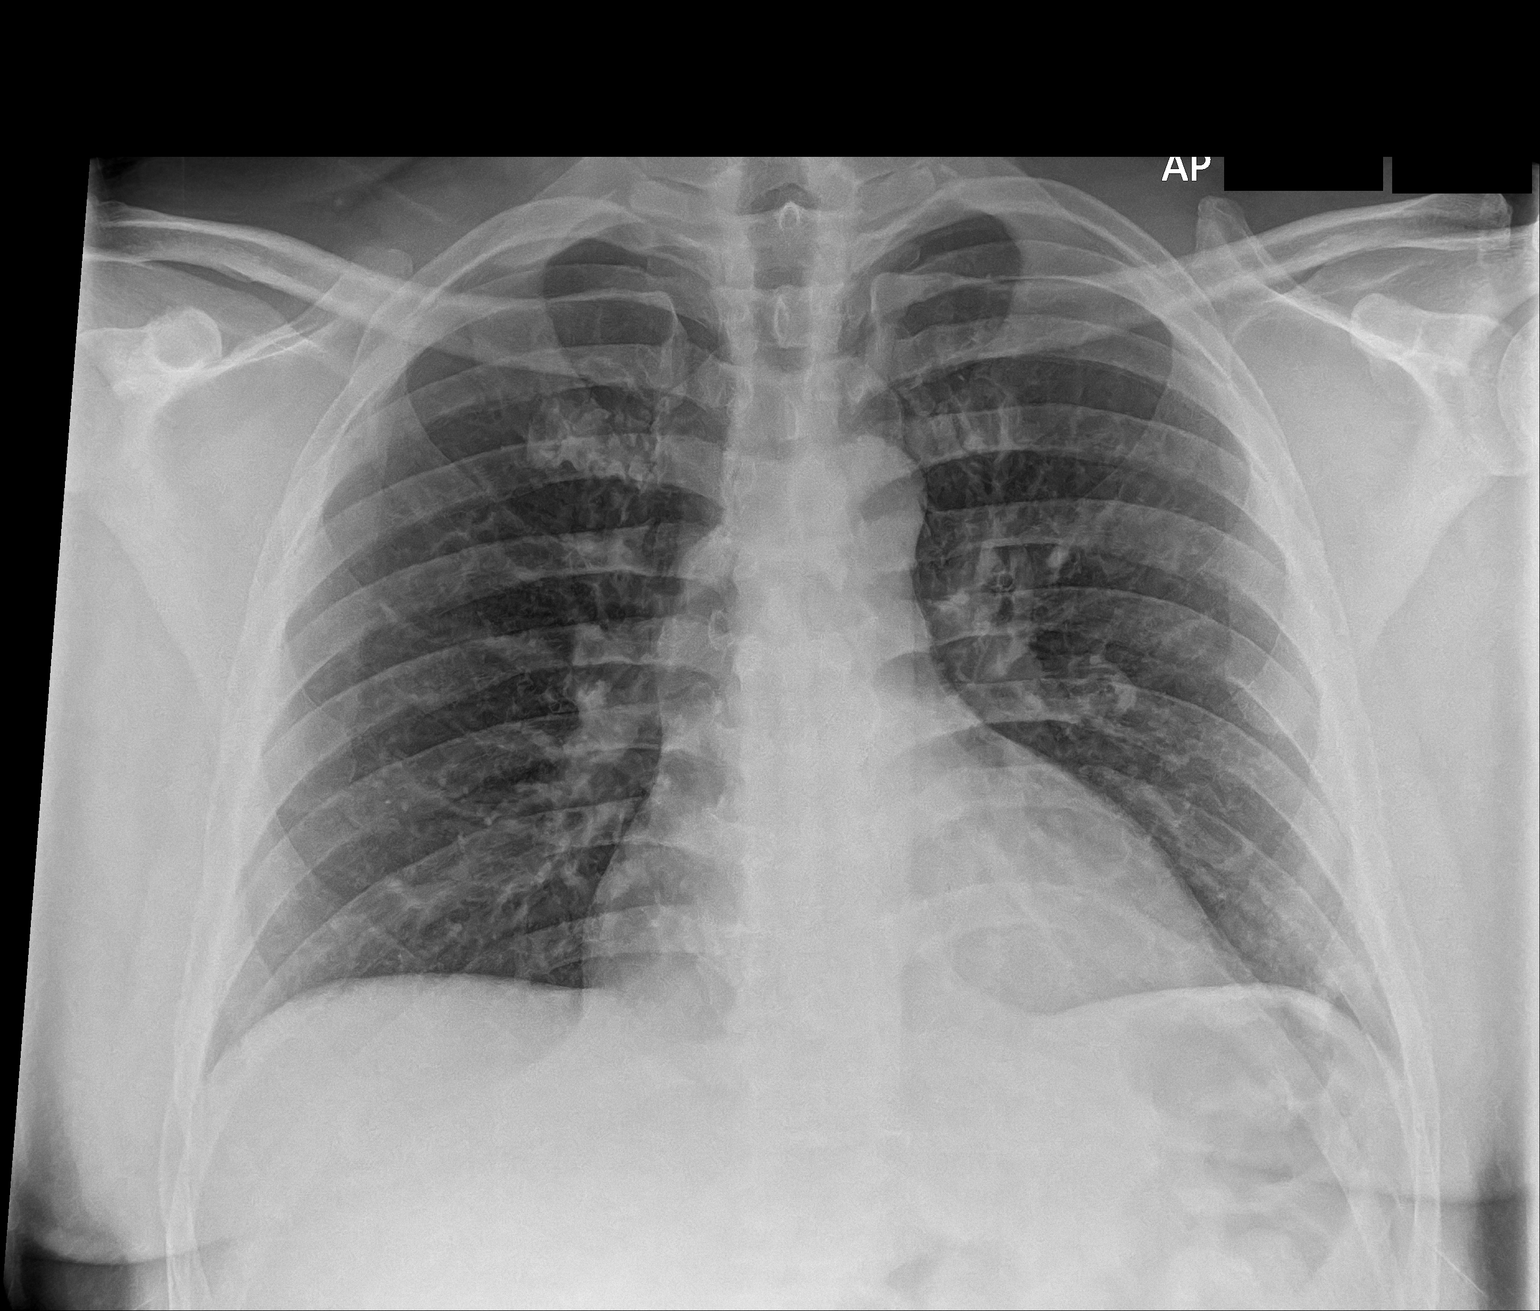

[1 of 1 positions shown; findings below may reference images not displayed]

FINDINGS: The heart size and mediastinal contours are within normal limits.
Both lungs are clear. The visualized skeletal structures are
unremarkable.
IMPRESSION: No active disease.

## 2022-12-09 ENCOUNTER — Other Ambulatory Visit: Payer: Self-pay

## 2022-12-09 ENCOUNTER — Emergency Department (HOSPITAL_COMMUNITY)
Admission: EM | Admit: 2022-12-09 | Discharge: 2022-12-10 | Disposition: A | Discharge status: Home / Self Care | Payer: 59 | Attending: Student | Admitting: Student

## 2022-12-09 DIAGNOSIS — J111 Influenza due to unidentified influenza virus with other respiratory manifestations: Secondary | ICD-10-CM

## 2022-12-09 DIAGNOSIS — Z20822 Contact with and (suspected) exposure to covid-19: Secondary | ICD-10-CM | POA: Insufficient documentation

## 2022-12-09 DIAGNOSIS — J101 Influenza due to other identified influenza virus with other respiratory manifestations: Secondary | ICD-10-CM | POA: Insufficient documentation

## 2022-12-09 LAB — GROUP A STREP BY PCR: Group A Strep by PCR: NOT DETECTED

## 2022-12-09 NOTE — ED Provider Triage Note (Signed)
Emergency Medicine Provider Triage Evaluation Note  Rilen Shukla , a 37 y.o. male  was evaluated in triage.  Pt complains of sore throat started yesterday, states he mainly feels pain on the left side of his neck, states it is hard to swallow was still able to swallow no associate fevers chills cough congestion, no history of strep throat in the past, no endorsing any GI symptoms systemic, rash, no new medications no new environmental changes..  Review of Systems  Positive: Sore throat, neck swelling Negative: Chest pain, shortness of breath  Physical Exam  BP (!) 148/99 (BP Location: Right Arm)   Pulse 78   Temp 98.6 F (37 C) (Oral)   Resp 18   SpO2 99%  Gen:   Awake, no distress   Resp:  Normal effort  MSK:   Moves extremities without difficulty  Other:    Medical Decision Making  Medically screening exam initiated at 10:40 PM.  Appropriate orders placed.  Thiago Ragsdale was informed that the remainder of the evaluation will be completed by another provider, this initial triage assessment does not replace that evaluation, and the importance of remaining in the ED until their evaluation is complete.  Lab work has been ordered will need further workup.   Marcello Fennel, PA-C 12/09/22 2241

## 2022-12-09 NOTE — ED Triage Notes (Signed)
Patient reports left sore throat with mild swelling onset yesterday .

## 2022-12-10 LAB — RESP PANEL BY RT-PCR (RSV, FLU A&B, COVID)  RVPGX2
Influenza A by PCR: NEGATIVE
Influenza B by PCR: POSITIVE — AB
Resp Syncytial Virus by PCR: NEGATIVE
SARS Coronavirus 2 by RT PCR: NEGATIVE

## 2022-12-10 NOTE — ED Provider Notes (Signed)
[facility] EMERGENCY DEPARTMENT Provider Note   CSN: [id number] Arrival date & time: 12/09/22  2207     History  Chief Complaint  Patient presents with   Sore Throat    Cody Owens is a 37 y.o. male.  HPI   Patient not significant medical history  complains of sore throat started yesterday, states he mainly feels pain on the left side of his neck, states it is hard to swallow but is still able to swallow no associate fevers chills cough congestion, no history of strep throat in the past, no endorsing any GI symptoms systemic, rash, no new medications no new environmental changes..   Home Medications Prior to Admission medications   Medication Sig Start Date End Date Taking? Authorizing Provider  acetaminophen (TYLENOL) 500 MG tablet Take 1,000 mg by mouth every 6 (six) hours as needed for mild pain.    [provider]  aspirin-acetaminophen-caffeine (EXCEDRIN MIGRAINE) [phone] MG tablet Take 2 tablets by mouth every 6 (six) hours as needed for headache.    [provider]  ibuprofen (ADVIL) 200 MG tablet Take 400 mg by mouth every 6 (six) hours as needed for moderate pain.    [provider]  lidocaine (LIDODERM) 5 % Place 1 patch onto the skin daily. Remove & Discard patch within 12 hours or as directed by MD 07/28/20   Lutricia Feil, PA-C  meloxicam (MOBIC) 15 MG tablet Take 1 tablet (15 mg total) by mouth daily. Take with food. 07/28/20   Lutricia Feil, PA-C  metaxalone (SKELAXIN) 800 MG tablet Take 1 tablet (800 mg total) by mouth 3 (three) times daily. 07/28/20   Lutricia Feil, PA-C      Allergies    Patient has no known allergies.    Review of Systems   Review of Systems  Constitutional:  Negative for chills and fever.  HENT:  Positive for sore throat.   Respiratory:  Negative for shortness of breath.   Cardiovascular:  Negative for chest pain.  Gastrointestinal:  Negative for abdominal pain.  Neurological:   Negative for headaches.    Physical Exam Updated Vital Signs BP (!) 155/106 (BP Location: Right Arm)   Pulse 62   Temp 98.6 F (37 C) (Oral)   Resp 18   SpO2 97%  Physical Exam Vitals and nursing note reviewed.  Constitutional:      General: He is not in acute distress.    Appearance: He is not ill-appearing.  HENT:     Head: Normocephalic and atraumatic.     Nose: No congestion.     Mouth/Throat:     Mouth: Mucous membranes are moist.     Pharynx: Posterior oropharyngeal erythema present. No oropharyngeal exudate.     Comments: No trismus no torticollis no oral edema present, tongue uvula both midline controlling oral secretions tonsils both equal symmetric bilaterally, does have noted erythema and cobblestoning in the posterior pharynx, no abscess present, no submandibular swelling no muffled tone voice. Eyes:     Conjunctiva/sclera: Conjunctivae normal.  Cardiovascular:     Rate and Rhythm: Normal rate and regular rhythm.     Pulses: Normal pulses.     Heart sounds: No murmur heard.    No friction rub. No gallop.  Pulmonary:     Effort: No respiratory distress.     Breath sounds: No wheezing, rhonchi or rales.  Skin:    General: Skin is warm and dry.  Neurological:     Mental  Status: He is alert.  Psychiatric:        Mood and Affect: Mood normal.     ED Results / Procedures / Treatments   Labs (all labs ordered are listed, but only abnormal results are displayed) Labs Reviewed  RESP PANEL BY RT-PCR (RSV, FLU A&B, COVID)  RVPGX2 - Abnormal; Notable for the following components:      Result Value   Influenza B by PCR POSITIVE (*)    All other components within normal limits  GROUP A STREP BY PCR    EKG None  Radiology No results found.  Procedures Procedures    Medications Ordered in ED Medications - No data to display  ED Course/ Medical Decision Making/ A&P                           Medical Decision Making  This patient presents to the ED for  concern of throat pain, this involves an extensive number of treatment options, and is a complaint that carries with it a high risk of complications and morbidity.  The differential diagnosis includes retropharyngeal/peritonsillar abscess, Ludwig angina,    Additional history obtained:  Additional history obtained from N/A External records from outside source obtained and reviewed including recent ER notes   Co morbidities that complicate the patient evaluation  N/A  Social Determinants of Health:  No PCP    Lab Tests:  I Ordered, and personally interpreted labs.  The pertinent results include: Strep test negative, respiratory panel positive for influenza B   Imaging Studies ordered:  I ordered imaging studies including N/A I independently visualized and interpreted imaging which showed N/A I agree with the radiologist interpretation   Cardiac Monitoring:  The patient was maintained on a cardiac monitor.  I personally viewed and interpreted the cardiac monitored which showed an underlying rhythm of: N/A   Medicines ordered and prescription drug management:  I ordered medication including N/A I have reviewed the patients home medicines and have made adjustments as needed  Critical Interventions:  N/A   Reevaluation:  Presents with a sore throat, respiratory panel positive for influenza B, strep test is negative, suspect he is likely suffering from viral pharyngitis, he is given discharge at this time  Consultations Obtained:  N/a    Test Considered:  N/a    Rule out I have low suspicion for peritonsillar abscess, retropharyngeal abscess, or Ludwig angina as oropharynx was visualized tongue and uvula were both midline, there is no exudates, erythema or edema noted in the posterior pillars or on/ around tonsils.  I doubt pneumonia as lung sounds are clear bilaterally will defer x-ray at this time.  Suspicion for anaphylaxis/angioedema is also low there is  no evidence of oral edema present my exam no systemic rash vital signs are all reassuring    Dispostion and problem list  After consideration of the diagnostic results and the patients response to treatment, I feel that the patent would benefit from discharge.  Influenza-likely causing viral pharyngitis, will defer on Tamiflu as patient has very mild symptoms he has no comorbidities which would increase his risk of adverse outcome, patient was also given this plan, will recommend symptom management.            Final Clinical Impression(s) / ED Diagnoses Final diagnoses:  Influenza    Rx / DC Orders ED Discharge Orders     None         Marcello Fennel,  PA-C 12/10/22 0111    Sabas Sous, MD 12/10/22 [phone]

## 2022-12-10 NOTE — Discharge Instructions (Signed)
You have the flu, recommend over-the-counter pain medications like ibuprofen Tylenol for fever and pain control, nasal decongestions like Flonase and Zyrtec, Mucinex for cough.  If not eating recommend supplementing with Gatorade to help with electrolyte supplementation.  Follow-up PCP for further evaluation.  Come back to the emergency department if you develop chest pain, shortness of breath, severe abdominal pain, uncontrolled nausea, vomiting, diarrhea.
